# Patient Record
Sex: Female | Born: 1975 | Race: Black or African American | Hispanic: No | Marital: Single | Smoking: Never smoker
Health system: Southern US, Community
[De-identification: ages and names within clinical notes are randomized; demographics above are authoritative.]

## PROBLEM LIST (undated history)

## (undated) DIAGNOSIS — B009 Herpesviral infection, unspecified: Secondary | ICD-10-CM

## (undated) DIAGNOSIS — K219 Gastro-esophageal reflux disease without esophagitis: Secondary | ICD-10-CM

## (undated) DIAGNOSIS — J309 Allergic rhinitis, unspecified: Secondary | ICD-10-CM

## (undated) DIAGNOSIS — R87619 Unspecified abnormal cytological findings in specimens from cervix uteri: Secondary | ICD-10-CM

## (undated) DIAGNOSIS — E119 Type 2 diabetes mellitus without complications: Secondary | ICD-10-CM

## (undated) DIAGNOSIS — T7840XA Allergy, unspecified, initial encounter: Secondary | ICD-10-CM

## (undated) DIAGNOSIS — IMO0002 Reserved for concepts with insufficient information to code with codable children: Secondary | ICD-10-CM

## (undated) DIAGNOSIS — I10 Essential (primary) hypertension: Secondary | ICD-10-CM

## (undated) HISTORY — DX: Allergic rhinitis, unspecified: J30.9

## (undated) HISTORY — DX: Allergy, unspecified, initial encounter: T78.40XA

## (undated) HISTORY — DX: Type 2 diabetes mellitus without complications: E11.9

## (undated) HISTORY — PX: CERVIX LESION DESTRUCTION: SHX591

## (undated) HISTORY — DX: Essential (primary) hypertension: I10

## (undated) HISTORY — DX: Gastro-esophageal reflux disease without esophagitis: K21.9

## (undated) HISTORY — DX: Reserved for concepts with insufficient information to code with codable children: IMO0002

## (undated) HISTORY — DX: Herpesviral infection, unspecified: B00.9

## (undated) HISTORY — DX: Unspecified abnormal cytological findings in specimens from cervix uteri: R87.619

## (undated) HISTORY — PX: WISDOM TOOTH EXTRACTION: SHX21

---

## 1995-12-26 HISTORY — PX: COLPOSCOPY: SHX161

## 1999-06-20 ENCOUNTER — Other Ambulatory Visit: Admission: RE | Admit: 1999-06-20 | Discharge: 1999-06-20 | Payer: Self-pay | Admitting: Obstetrics and Gynecology

## 1999-10-20 ENCOUNTER — Emergency Department (HOSPITAL_COMMUNITY): Admission: EM | Admit: 1999-10-20 | Discharge: 1999-10-20 | Payer: Self-pay | Admitting: *Deleted

## 1999-10-24 ENCOUNTER — Emergency Department (HOSPITAL_COMMUNITY): Admission: EM | Admit: 1999-10-24 | Discharge: 1999-10-24 | Payer: Self-pay | Admitting: Emergency Medicine

## 1999-11-09 ENCOUNTER — Encounter: Admission: RE | Admit: 1999-11-09 | Discharge: 1999-11-30 | Payer: Self-pay | Admitting: Family Medicine

## 2000-06-06 ENCOUNTER — Other Ambulatory Visit: Admission: RE | Admit: 2000-06-06 | Discharge: 2000-06-06 | Payer: Self-pay | Admitting: Family Medicine

## 2000-10-30 ENCOUNTER — Inpatient Hospital Stay (HOSPITAL_COMMUNITY): Admission: AD | Admit: 2000-10-30 | Discharge: 2000-10-30 | Payer: Self-pay | Admitting: *Deleted

## 2001-02-24 ENCOUNTER — Inpatient Hospital Stay (HOSPITAL_COMMUNITY): Admission: AD | Admit: 2001-02-24 | Discharge: 2001-02-24 | Payer: Self-pay | Admitting: Obstetrics and Gynecology

## 2001-05-18 ENCOUNTER — Inpatient Hospital Stay (HOSPITAL_COMMUNITY): Admission: AD | Admit: 2001-05-18 | Discharge: 2001-05-20 | Payer: Self-pay | Admitting: Obstetrics and Gynecology

## 2001-05-21 ENCOUNTER — Encounter: Admission: RE | Admit: 2001-05-21 | Discharge: 2001-06-20 | Payer: Self-pay | Admitting: Obstetrics and Gynecology

## 2002-08-08 ENCOUNTER — Other Ambulatory Visit: Admission: RE | Admit: 2002-08-08 | Discharge: 2002-08-08 | Payer: Self-pay | Admitting: Family Medicine

## 2005-02-07 ENCOUNTER — Ambulatory Visit: Payer: Self-pay | Admitting: Family Medicine

## 2005-09-11 ENCOUNTER — Ambulatory Visit: Payer: Self-pay | Admitting: Family Medicine

## 2005-11-24 HISTORY — PX: PARTIAL HYSTERECTOMY: SHX80

## 2005-12-12 ENCOUNTER — Observation Stay (HOSPITAL_COMMUNITY): Admission: RE | Admit: 2005-12-12 | Discharge: 2005-12-13 | Payer: Self-pay | Admitting: Obstetrics and Gynecology

## 2005-12-12 ENCOUNTER — Encounter (INDEPENDENT_AMBULATORY_CARE_PROVIDER_SITE_OTHER): Payer: Self-pay | Admitting: Specialist

## 2006-08-31 ENCOUNTER — Ambulatory Visit: Payer: Self-pay | Admitting: Family Medicine

## 2006-09-24 ENCOUNTER — Encounter: Payer: Self-pay | Admitting: Family Medicine

## 2006-09-24 LAB — CONVERTED CEMR LAB

## 2006-12-04 ENCOUNTER — Ambulatory Visit: Payer: Self-pay | Admitting: Family Medicine

## 2006-12-14 ENCOUNTER — Ambulatory Visit: Payer: Self-pay | Admitting: Family Medicine

## 2007-02-13 ENCOUNTER — Ambulatory Visit: Payer: Self-pay | Admitting: Family Medicine

## 2007-07-02 ENCOUNTER — Ambulatory Visit: Payer: Self-pay | Admitting: Family Medicine

## 2007-07-02 DIAGNOSIS — J309 Allergic rhinitis, unspecified: Secondary | ICD-10-CM | POA: Insufficient documentation

## 2007-07-02 DIAGNOSIS — K219 Gastro-esophageal reflux disease without esophagitis: Secondary | ICD-10-CM | POA: Insufficient documentation

## 2007-07-02 LAB — CONVERTED CEMR LAB
Glucose, Urine, Semiquant: NEGATIVE
Ketones, urine, test strip: NEGATIVE
Nitrite: NEGATIVE
Specific Gravity, Urine: 1.01
Urobilinogen, UA: 0.2
pH: 6.5

## 2007-10-02 ENCOUNTER — Ambulatory Visit: Payer: Self-pay | Admitting: Family Medicine

## 2007-10-02 DIAGNOSIS — R300 Dysuria: Secondary | ICD-10-CM | POA: Insufficient documentation

## 2007-10-02 LAB — CONVERTED CEMR LAB
Bilirubin Urine: NEGATIVE
Ketones, urine, test strip: NEGATIVE
Nitrite: NEGATIVE
Protein, U semiquant: NEGATIVE
Urobilinogen, UA: NEGATIVE

## 2007-10-04 ENCOUNTER — Encounter (INDEPENDENT_AMBULATORY_CARE_PROVIDER_SITE_OTHER): Payer: Self-pay | Admitting: Internal Medicine

## 2007-10-07 ENCOUNTER — Encounter (INDEPENDENT_AMBULATORY_CARE_PROVIDER_SITE_OTHER): Payer: Self-pay | Admitting: Internal Medicine

## 2007-12-04 ENCOUNTER — Ambulatory Visit: Payer: Self-pay | Admitting: Family Medicine

## 2007-12-04 DIAGNOSIS — D692 Other nonthrombocytopenic purpura: Secondary | ICD-10-CM | POA: Insufficient documentation

## 2007-12-05 ENCOUNTER — Encounter (INDEPENDENT_AMBULATORY_CARE_PROVIDER_SITE_OTHER): Payer: Self-pay | Admitting: Internal Medicine

## 2007-12-16 ENCOUNTER — Encounter (INDEPENDENT_AMBULATORY_CARE_PROVIDER_SITE_OTHER): Payer: Self-pay | Admitting: Internal Medicine

## 2007-12-26 HISTORY — PX: LAMINECTOMY AND MICRODISCECTOMY LUMBAR SPINE: SHX1913

## 2007-12-30 ENCOUNTER — Ambulatory Visit: Payer: Self-pay | Admitting: Family Medicine

## 2007-12-30 DIAGNOSIS — M79609 Pain in unspecified limb: Secondary | ICD-10-CM

## 2008-01-01 ENCOUNTER — Telehealth: Payer: Self-pay | Admitting: Family Medicine

## 2008-01-02 ENCOUNTER — Encounter: Payer: Self-pay | Admitting: Family Medicine

## 2008-01-03 ENCOUNTER — Telehealth: Payer: Self-pay | Admitting: Family Medicine

## 2008-01-10 ENCOUNTER — Encounter: Payer: Self-pay | Admitting: Family Medicine

## 2008-01-27 ENCOUNTER — Ambulatory Visit (HOSPITAL_COMMUNITY): Admission: RE | Admit: 2008-01-27 | Discharge: 2008-01-27 | Payer: Self-pay | Admitting: Neurosurgery

## 2008-02-09 ENCOUNTER — Emergency Department: Payer: Self-pay | Admitting: Emergency Medicine

## 2008-02-09 ENCOUNTER — Encounter: Payer: Self-pay | Admitting: Family Medicine

## 2008-02-14 ENCOUNTER — Ambulatory Visit: Payer: Self-pay | Admitting: Family Medicine

## 2008-02-14 DIAGNOSIS — M5126 Other intervertebral disc displacement, lumbar region: Secondary | ICD-10-CM | POA: Insufficient documentation

## 2008-05-21 ENCOUNTER — Ambulatory Visit: Payer: Self-pay | Admitting: Family Medicine

## 2008-05-21 DIAGNOSIS — M545 Low back pain: Secondary | ICD-10-CM

## 2008-09-07 ENCOUNTER — Ambulatory Visit: Payer: Self-pay | Admitting: Family Medicine

## 2008-09-07 DIAGNOSIS — J019 Acute sinusitis, unspecified: Secondary | ICD-10-CM

## 2008-09-07 DIAGNOSIS — N760 Acute vaginitis: Secondary | ICD-10-CM | POA: Insufficient documentation

## 2008-09-07 DIAGNOSIS — J329 Chronic sinusitis, unspecified: Secondary | ICD-10-CM | POA: Insufficient documentation

## 2010-02-02 ENCOUNTER — Telehealth: Payer: Self-pay | Admitting: Family Medicine

## 2010-04-18 ENCOUNTER — Ambulatory Visit: Payer: Self-pay | Admitting: Family Medicine

## 2011-01-24 NOTE — Progress Notes (Signed)
Summary: back pain  Phone Note Call from Patient Call back at [phone]   Caller: Patient Call For: Sophia Part MD Summary of Call: Pt states she turned wrong yesterday morning and pulled her back.  She is having muscle spams. She has an appt with you on friday but is asking if she can have something called in- she had flexeri and vicodin before.  Taking ibuprofen and using a heating pad. Uses [facility] [address] Initial call taken by: Lowella Petties CMA,  February 02, 2010 12:01 PM  Follow-up for Phone Call        can try flexeril - use caution  if any significant numbness or weakness- update me  px written on EMR for call in  Follow-up by: Sophia Part MD,  February 02, 2010 1:40 PM  Additional Follow-up for Phone Call Additional follow up Details #1::        Patient notified as instructed by telephone. Medication phoned to [facility] [address] [place]. [phone] pharmacy as instructed. Lewanda Rife LPN  February 02, 2010 3:46 PM      New/Updated Medications: FLEXERIL 10 MG TABS (CYCLOBENZAPRINE HCL) 1 by mouth up to three times a day as needed back pain  watch for sedation Prescriptions: FLEXERIL 10 MG TABS (CYCLOBENZAPRINE HCL) 1 by mouth up to three times a day as needed back pain  watch for sedation  #30 x 0   Entered and Authorized by:   Sophia Part MD   Signed by:   Sophia Part MD on 02/02/2010   Method used:   Telephoned to ...         RxID:   [id number]

## 2011-01-24 NOTE — Assessment & Plan Note (Signed)
Summary: SINUS INFECTION/DLO   Vital Signs:  Patient profile:   35 year old female Height:      62 inches Weight:      184.50 pounds BMI:     33.87 Temp:     99.2 degrees F oral Pulse rate:   88 / minute Pulse rhythm:   regular BP sitting:   130 / 88  (left arm) Cuff size:   large  Vitals Entered By: Sydell Axon LPN (April 18, 2010 2:19 PM) CC: ? Sinus infection, head congestion, face feels swollen, nasal congestion yellow with some blood   History of Present Illness: Pt here for facial swelling and nasal conmgestion with yellow discharge. Throt itching and coughing a lot. She has been warm, she has chills, headache bitemporal, no ear pain but hads congestion, Nasal congestion with occas yellow and blody mucous, Some ST, Coughing up but not getting much phlegm, no SOB, no N/V. She has taken Alka Selzer Plus and had used Aleve.   Allergies: 1)  ! * Monostat 2)  ! Morphine 3)  Vioxx  Physical Exam  General:  alert, well-developed, well-nourished, and well-hydrated.  NAD Head:  normocephalic, atraumatic, and no abnormalities observed.  minimal  maxillary and frontal sinus tenderness  Eyes:  Conjunctiva clear bilaterally.  Ears:  L TM mildly distaorted. R TM significantly distorted but nonerythem without fluid. Nose:  nares are congested and injected bilaterally, R>>>L. Mouth:  pharynx pink and moist, no erythema, and no exudates.   Neck:  supple with full rom and no masses or thyromegally, no JVD or carotid bruit  Lungs:  Normal respiratory effort, chest expands symmetrically. Lungs are clear to auscultation, no crackles or wheezes. Heart:  Normal rate and regular rhythm. S1 and S2 normal without gallop, murmur, click, rub or other extra sounds.   Impression & Recommendations:  Problem # 1:  URI (ICD-465.9) See instructions Her updated medication list for this problem includes:    Tessalon 200 Mg Caps (Benzonatate) ..... One tab by mouth three times a day as needed  cough  Instructed on symptomatic treatment. Call if symptoms persist or worsen.   Complete Medication List: 1)  Prilosec Cpdr (Omeprazole cpdr) .... Take by mouth as directed prn 2)  Multi-vitamin Tabs (Multiple vitamin) .... Take by mouth as directed 3)  Flonase 50 Mcg/act Susp (Fluticasone propionate) .... 2 sprays each nostril as needed 4)  Flexeril 10 Mg Tabs (Cyclobenzaprine hcl) .Marland Kitchen.. 1 by mouth up to three times a day as needed back pain  watch for sedation 5)  Amoxicillin 500 Mg Caps (Amoxicillin) .... 2 tabs by mouth two times a day 6)  Tessalon 200 Mg Caps (Benzonatate) .... One tab by mouth three times a day as needed cough  Patient Instructions: 1)  Take Guaifenesin by [facility] to [facility] and getting MUCOUS RELIEF EXPECTORANT (400mg ), take 11/2 tabs by mouth AM and NOON. 2)  Drink lots of fluids anytime taking Guaifenesin.  3)  Take Tyl ES 2 tabs by mouth three times a day  4)  Keep Lozenge in mouth for a few fdays while awake.  5)  Vicks as directed. 6)  Tessalon three times a day for cough 7)  If no better by Thu, start Amox. Prescriptions: TESSALON 200 MG CAPS (BENZONATATE) one tab by mouth three times a day as needed cough  #30 x 0   Entered and Authorized by:   Shaune Leeks MD   Signed by:  Shaune Leeks MD on 04/18/2010   Method used:   Electronically to        [facility]. [address] [phone]* (retail)       [address] [place], [place]  [id number]       Ph: [phone]       Fax: [phone]   RxID:   [id number] AMOXICILLIN 500 MG CAPS (AMOXICILLIN) 2 tabs by mouth two times a day  #40 x 0   Entered and Authorized by:   Shaune Leeks MD   Signed by:   Shaune Leeks MD on 04/18/2010   Method used:   Electronically to        [facility]. [address] [phone]* (retail)       [address] [place], [place]  [id number]       Ph: [phone]       Fax: [phone]   RxID:    [id number]   Current Allergies (reviewed today): ! * MONOSTAT ! MORPHINE VIOXX

## 2011-03-29 ENCOUNTER — Other Ambulatory Visit: Payer: Self-pay | Admitting: *Deleted

## 2011-03-29 MED ORDER — FLUTICASONE PROPIONATE 50 MCG/ACT NA SUSP: 2.0000 | Freq: Every day | NASAL | Status: DC

## 2011-05-09 NOTE — Op Note (Signed)
[name], [name]              ACCOUNT NO.:  [id number]   MEDICAL RECORD NO.:  [id number]          PATIENT TYPE:  INP   LOCATION:  2857                         FACILITY:  [facility]   PHYSICIAN:  Kathaleen Maser. Pool, M.D.    DATE OF BIRTH:  09-26-1976   DATE OF PROCEDURE:  01/27/2008  DATE OF DISCHARGE:                               OPERATIVE REPORT   PREOPERATIVE DIAGNOSIS:  Left L5-S1 herniated nucleus pulposus with  radiculopathy.   POSTOPERATIVE DIAGNOSIS:  Left L5-S1 herniated nucleus pulposus with  radiculopathy.   PROCEDURE NOTE:  Left L5-S1 laminotomy and microdiscectomy.   SURGEON:  Kathaleen Maser. Pool, M.D.   ASSISTANT:  Donalee Citrin, M.D.   ANESTHESIA:  General endotracheal anesthesia.   PREMEDICATION:  Ms. Logan Bores is a 35 year old female with history of back  and left lower extremity pain, paresthesias, weakness.  There is a left-  sided S1 radiculopathy which failed conservative.  Workup demonstrates  evidence of a leftward L5-S1 disk herniation with compression of the  left-sided S1 nerve root.  The patient has been counseled as to her  options.  She desired to proceed with left-sided L5-S1 laminotomy and  microdiscectomy in hopes to improve her symptoms.   DESCRIPTION OF PROCEDURE:  Patient was brought to the operating room and  placed on the operating table in supine position.  After adequate level  of anesthesia was achieved, the patient was positioned prone onto Wilson  frame, appropriately padded the patient's lumbar region,  prepped and  draped sterilely.  A 10-blade was used to make a linear skin incision  overlying the L5-S1 interspace.  This was carried down sharply in the  midline.  Subperiosteal dissection was performed to expose the lamina of  facet joints at L5 and S1 on the left side.  Deep self-retaining  retractor was placed.  Intraoperative x-rays taken and level was  confirmed.  Laminotomy was then performed using high-speed drill and  Kerrison rongeurs to  remove the inferior aspect of the lamina of L5,  medial aspect of L5-S1 facet joints and the superior rim of the S1  lamina.  Ligament flavum was then elevated and resected in piecemeal  fashion.  Underlying thecal sac and exiting S1 nerve root were  identified.  The microscope was brought into the field for  microdissection left side S1 nerve root and underlying disk herniation.  Epidural venous plexus coagulated and cut.  Thecal sac and S1 nerve root  were gently mobilized retracted towards the midline.  Disk herniation  was readily apparent.  This was then incised with a 15 blade in  rectangular fashion.  Wide disk space clean-out was achieved using  pituitary rongeurs, upward angle pituitary rongeurs and Epstein  curettes.  All elements of the disk herniation were completely resected.  All loose or obviously degenerative disk material was removed from the  interspace.  After very thorough diskectomy had been achieved, wound was  then irrigated with antibiotic solution.  Gelfoam was placed topically  for hemostasis and found to be good.  Microscope and retractor system  were  removed.  Hemostasis was achieved with  electrocautery.  Wounds were  closed in layers with Vicryl sutures.  Steri-Strips and sterile dressing  were applied.  There were no apparent complications.  The patient  tolerated the procedure well and she returns to the recovery room  postoperatively.           ______________________________  Kathaleen Maser Pool, M.D.     HAP/MEDQ  D:  01/27/2008  T:  01/27/2008  Job:  [id number]
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
## 2011-05-12 NOTE — H&P (Signed)
[name], [name]              ACCOUNT NO.:  [id number]   MEDICAL RECORD NO.:  [id number]          PATIENT TYPE:  OBV   LOCATION:  9399                          [facility]ILITY:  [facility]   PHYSICIAN:  Naima A. Dillard, M.D. DATE OF BIRTH:  [birth year]   DATE OF ADMISSION:  12/12/2005  DATE OF DISCHARGE:                                HISTORY & PHYSICAL   CHIEF COMPLAINT:  Symptomatic fibroids.   HISTORY OF PRESENT ILLNESS:  The patient is a 35 year old African-American  female gravida 2, para 2 who presented to me on September 26, 2005 for  sonohysterogram. She was found to have a submucosal fibroid and endometrial  polyp and patient was scheduled for hysteroscopy, D and C, removal of  fibroid and polyp.  The patient returned to me a couple of weeks later, and  after talking with her husband, stated that she was confident that she did  not want to have any more children and wanted definitive treatment, wanted a  hysterectomy.  The patient understands the risks of hysterectomy are, but  not limited to, bleeding, infection, damage to internal organs such as  bowel, bladder, major blood vessels. Patient signed hysterectomy consent and  desires to proceed.  She presented back on September 04, 2005 complaining of  irregular bleeding stating that her menses had been heavy since the birth of  her last child.  She had a five day flow with change of pads every 1-1/2  hours with some cramps and occasionally soiling of clothing.  She also had  some postcoital spotting.  The patient was on Seasonal for birth control,  does have fibroids, has not been on any new medications.  She denies  menopausal symptoms or vaginal discharge, increased stress or severe  abdominal pain.   PAST MEDICAL HISTORY:  As above.   PAST SURGICAL HISTORY:  Significant for cryosurgery of cervix.   OBSTETRICAL HISTORY:  Past OB history significant for vaginal delivery X2  without any complications.   SOCIAL HISTORY:  Negative  for alcohol, tobacco or drug use.   FAMILY HISTORY:  Significant for grandfather with diabetes and maternal  grandmother with hypertension.   REVIEW OF SYSTEMS:  GENITOURINARY:  See above.  ENDOCRINE, MUSCULOSKELETAL,  PSYCHIATRIC, CARDIOVASCULAR, RESPIRATORY and GASTROINTESTINAL are all  unremarkable.   PHYSICAL EXAMINATION:  VITAL SIGNS:  The patient weighs 187 pounds.  Blood  pressure 130/78.  HEENT: Pupils are equal.  Hearing is normal.  Throat is clear.  NECK:  Thyroid is not enlarged.  HEART:  Has regular rate and rhythm.  LUNGS:  Clear to auscultation bilaterally.  ABDOMEN:  Soft and nontender without any masses or organomegaly.  EXTREMITIES:  No cyanosis, clubbing or edema.  NEUROLOGICAL:  Examination is within normal limits.  PELVIC:  Full vaginal examination is within normal limits.  Cervix is  nontender without any lesions.  Uterus is normal shape, size and  consistency.  Adnexa has no masses and nontender.  Rectovaginal examination  shows no masses.   LABORATORY DATA:  Pertinent data includes ultrasound showing uterine length  measuring 8.62 X 5.05 X 5.33 with anterior  fibroid measuring 2.6 X 1.7 X  2.13 and a posterior submucosal fibroid measuring 2.3 X 1.7 cm.  Hemoglobin  is 13.7. TSH is normal at 1.401.  Gonorrhea and Chlamydia both negative.   ASSESSMENT:  Symptomatic fibroids causing menorrhagia.  Patient was offered  observation, to continue or different forms of birth control, hysteroscopic  myomectomy and polypectomy, Lupron and hysterectomy.  Patient was going to  have a hysteroscopic myomectomy at first and she did receive Lupron but  later decided she wanted definitive treatment.  The plan is a  laparoscopically assisted vaginal hysterectomy, possible total abdominal  hysterectomy.  The patient understands the risks of hysterectomy and has  signed the consent.      Naima A. Normand Sloop, M.D.  Electronically Signed     NAD/MEDQ  D:  12/11/2005  T:   12/12/2005  Job:  [id number]

## 2011-05-12 NOTE — Discharge Summary (Signed)
Sophia, [name]              ACCOUNT NO.:  [id number]   MEDICAL RECORD NO.:  [id number]          PATIENT TYPE:  OBV   LOCATION:  9316                          FACILITY:  [facility]   PHYSICIAN:  Naima A. Dillard, M.D. DATE OF BIRTH:  1976/01/19   DATE OF ADMISSION:  12/12/2005  DATE OF DISCHARGE:  12/13/2005                                 DISCHARGE SUMMARY   DISCHARGE DIAGNOSES:  1.  Symptomatic uterine fibroids.  2.  Menorrhagia.   OPERATION:  On the date of admission, the patient underwent a  laparoscopically-assisted vaginal hysterectomy, tolerating procedure well.  The patient was found to have a uterus weighing approximately 105 grams,  along with normal appearing tubes and ovaries bilaterally.   HISTORY OF PRESENT ILLNESS:  Ms. [name] is a 35 year old, African-American  female, para 2-0-0-2 who presents for hysterectomy because of symptomatic  uterine fibroids.  Please see the patient's dictated history and physical  examination for details.   PREOPERATIVE PHYSICAL EXAMINATION:  VITAL SIGNS:  Weight is 187 pounds,  blood pressure 130/78.  GENERAL:  Within normal limits.  PELVIC:  Full vaginal exam is within normal limits.  Cervix is nontender  without any lesions.  Uterus is normal shape, size, and consistency.  Adnexa  had no masses and is nontender.  Rectovaginal exam shows no masses.   HOSPITAL COURSE:  On the date of admission, the patient underwent  aforementioned procedure, tolerating it well.  Postoperative course was  unremarkable with the patient tolerating a post-op hemoglobin of 11.4,  (preop hemoglobin 13.4).  By post-op day #1, the patient had resumed bowel  and bladder function and was therefore, deemed ready for discharge home.   DISCHARGE MEDICATIONS:  1.  Phenergan 25 mg every six hours as needed for nausea.  2.  Colace 100 mg twice daily until bowel movements are regular.  3.  Tylox 1-2 tablets every four hours as needed for pain.   FOLLOW UP:  The  patient is scheduled for a six weeks post-op visit with Dr.  Normand Sloop on January 22, 2006 at 2:30 p.m..   DISCHARGE INSTRUCTIONS:  The patient was given a copy of [facility] postoperative instruction sheet.  She was further advised to avoid  driving for 2 weeks, heavy lifting for 4 weeks, intercourse for 6 weeks,  that she may bathe, walk up steps, and is to increase her activities slowly.   The patient's diet is without restriction.   FINAL PATHOLOGY:  Uterus and cervix:  Slight cervicitis, proliferative  endometrium.  No evidence of hyperplasia or malignancy, benign myometrium,  benign uterine serosa.      [place] J. Adline Peals.      Naima A. Normand Sloop, M.D.  Electronically Signed    EJP/MEDQ  D:  12/29/2005  T:  12/29/2005  Job:  [id number]

## 2011-05-12 NOTE — Op Note (Signed)
[name], [name]              ACCOUNT NO.:  [id number]   MEDICAL RECORD NO.:  [id number]          PATIENT TYPE:  OBV   LOCATION:  9316                          FACILITY:  [facility]   PHYSICIAN:  Naima A. Dillard, M.D. DATE OF BIRTH:  [birth year]   DATE OF PROCEDURE:  12/12/2005  DATE OF DISCHARGE:                                 OPERATIVE REPORT   PREOPERATIVE DIAGNOSIS:  Symptomatic uterine fibroids and menorrhagia.   POSTOPERATIVE DIAGNOSIS:  Symptomatic uterine fibroids and menorrhagia.   OPERATION/PROCEDURE:  Laparoscopic-assisted vaginal hysterectomy.   SURGEON:  Naima A. Normand Sloop, M.D.   ASSISTANTMarquis Lunch. Powell, P.A.-C.   ANESTHESIA:  General endotracheal anesthesia.   SPECIMENS:  Uterus and cervix, 105 g.   ESTIMATED BLOOD LOSS:  100 mL.   URINARY OUTPUT:  700 mL clear urine.   IV FLUIDS:  2500 mL.   COMPLICATIONS:  None.   DISPOSITION:  The patient went to PACU in stable condition.   DESCRIPTION OF PROCEDURE:  The patient was taken to the operating room where  she was given general anesthesia and placed in the dorsal lithotomy position  and prepped and draped in the normal sterile fashion.  A bivalve speculum  was placed into the vagina.  The anterior lip of the cervix grasped with a  single-tooth tenaculum and the Hulka manipulator was placed into the uterus.  The speculum was then removed from the vagina.   Attention was then turned to the umbilicus where a 10 mm infraumbilical  incision was made after 5 mL of 0.25% Marcaine was injected and this was  carried down to the fascia.  The fascia was incised in the midline and  extended bilaterally.  The peritoneum was identified, tented up and entered  sharply.  The fascia was then stitched with 0 Vicryl in a pursestring  fashion and the Hasson was placed and anchored a suture.  Abdomen was  insufflated with CO2 gas.  Intra-abdominal placement was confirmed with the  laparoscope.  The patient had about a  six-week size uterus, small fibroids,  again her fibroids are mainly submucosal, normal-appearing tubes and  ovaries, normal-appearing abdominal anatomy.  Her appendix  could not be  visualized.  In the right lower quadrant and left lower quadrant 5 mL  incisions were made after infiltration with 0.25% Marcaine and two 5 mm  trocars were placed under direct visualization with the laparoscope.  The  patient's round ligaments were grasped and cauterized and water dissected.  The bladder flap was water dissected.  The vesicouterine peritoneum was then  cut with Metzenbaum scissors and pulled away to create a bladder flap.  The  patient's right utero-ovarian ligament was cauterized and cut.  Hemostasis  was assured.  The patient's left utero-ovarian ligament was cauterized and  cut.  Hemostasis was assured.   Attention was then turned to the vagina where a small weighted speculum was  placed in the vagina.  The Hulka tenaculum was then removed.  The single-  tooth tenaculum was placed on the cervix and the cervix was infiltrated with  20 mL of Pitressin.  Mixture is 20 in 100 mL.  A circumferential incision  was then made around the cervix.  The bladder and rectum was then dissected  away from the cervix.  The posterior and anterior cul-de-sac were entered  without difficulty.  Both uterosacral ligaments were flanked with Heaney  clamps, cut and suture ligated.  Hemostasis was assured.  Both uterine  arteries were doubly clamped, cut and suture ligated.  Hemostasis was  assured.  The uterus was removed.  There was some bleeding along the cuff of  the left side of the patient which was made hemostatic with figure-of-eight  stitch.  McCall suture was placed without difficulty.  The cuff was closed  in a horizontal fashion using 0 chromic.  Hemostasis was assured.   Attention was then turned back to the abdomen.  The abdomen was again  insufflated.  The area was irrigated and all irrigation was  removed.  The  two 5 mm ports were removed under direct visualization of the laparoscope.  The Hasson was then removed.  The pursestring suture was then tied to close  the fascia.  The skin incisions were closed in subcuticular fashion using 3-  0 Monocryl.  The vagina was packed with vaginal packing with aminoserve  cream.  Sponge, lap and needle counts were correct x2.  The patient went to  the recovery room in stable condition.      Naima A. Normand Sloop, M.D.  Electronically Signed     NAD/MEDQ  D:  12/12/2005  T:  12/13/2005  Job:  [id number]

## 2011-09-15 LAB — TYPE AND SCREEN
ABO/RH(D): B NEG
Antibody Screen: NEGATIVE

## 2011-09-15 LAB — CBC
MCHC: 33.9
Platelets: 191
RDW: 13.1

## 2012-03-13 ENCOUNTER — Encounter (INDEPENDENT_AMBULATORY_CARE_PROVIDER_SITE_OTHER): Payer: 59 | Admitting: Obstetrics and Gynecology

## 2012-03-13 DIAGNOSIS — A599 Trichomoniasis, unspecified: Secondary | ICD-10-CM

## 2012-03-13 DIAGNOSIS — N760 Acute vaginitis: Secondary | ICD-10-CM

## 2012-09-16 ENCOUNTER — Encounter: Payer: Self-pay | Admitting: Family Medicine

## 2012-09-16 ENCOUNTER — Ambulatory Visit (INDEPENDENT_AMBULATORY_CARE_PROVIDER_SITE_OTHER): Payer: 59 | Admitting: Family Medicine

## 2012-09-16 VITALS — BP 158/94 | HR 78 | Temp 98.5°F | Ht 63.0 in | Wt 182.8 lb

## 2012-09-16 DIAGNOSIS — I10 Essential (primary) hypertension: Secondary | ICD-10-CM

## 2012-09-16 DIAGNOSIS — R519 Headache, unspecified: Secondary | ICD-10-CM | POA: Insufficient documentation

## 2012-09-16 DIAGNOSIS — R51 Headache: Secondary | ICD-10-CM

## 2012-09-16 DIAGNOSIS — R739 Hyperglycemia, unspecified: Secondary | ICD-10-CM

## 2012-09-16 DIAGNOSIS — R7309 Other abnormal glucose: Secondary | ICD-10-CM

## 2012-09-16 MED ORDER — LISINOPRIL-HYDROCHLOROTHIAZIDE 10-12.5 MG PO TABS: 1.0000 | ORAL_TABLET | Freq: Every day | ORAL | Status: DC

## 2012-09-16 NOTE — Assessment & Plan Note (Signed)
Per pt thirst and freq urination ? 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

## 2012-09-16 NOTE — Assessment & Plan Note (Signed)
I think this is rel to bp  Will see how it is as this comes down  If not - investigate other causes Can also investigate poss of beta blocker or ca channel blocker

## 2012-09-16 NOTE — Progress Notes (Signed)
Subjective:    Patient ID: [name], female    DOB: October 07, 1976, 36 y.o.   MRN: [id number]  HPI Here with inc bp and headaches  More headaches lately- thought it was from her sinuses  Went to wellness check at work- bp high Over weekend - went to grocery store and started to check bp there 160/90s for the most part   Not taking decongestants No diet pills    Mother had HTN   Is overwt   Head aches - at the temples/ pressure sensation  R ear bothers her on and off  She may have migraines - hard to tell  Not eating much sodium- has to be sodium free for her son Does not eat out often  Not swollen, but tends to feel generally bloated  Exercise is sporatic    Patient Active Problem List  Diagnosis  . OTHER NONTHROMBOCYTOPENIC PURPURAS  . SINUSITIS- ACUTE-NOS  . ALLERGIC RHINITIS  . GERD  . BACTERIAL VAGINITIS  . HERNIATED LUMBAR DISC  . LOW BACK PAIN, ACUTE  . LEG PAIN, LEFT  . DYSURIA   Past Medical History  Diagnosis Date  . Allergic rhinitis     seasonal  . GERD (gastroesophageal reflux disease)    Past Surgical History  Procedure Date  . Colposcopy 1997    abnormal pap  . Partial hysterectomy 11/2005    bleeding  . Laminectomy and microdiscectomy lumbar spine 12/2007    L5-S1   History  Substance Use Topics  . Smoking status: Never Smoker   . Smokeless tobacco: Not on file  . Alcohol Use: Yes     social   Family History  Problem Relation Age of Onset  . Hypertension Mother 64  . Diabetes Paternal Grandfather   . Breast cancer Maternal Grandmother   . Colon cancer Other     MGaunt  . Stroke Paternal Grandmother    Allergies  Allergen Reactions  . Morphine     REACTION: skin crawling  . Rofecoxib     REACTION: blurred vision, dizziness   Current Outpatient Prescriptions on File Prior to Visit  Medication Sig Dispense Refill  . fluticasone (FLONASE) 50 MCG/ACT nasal spray 2 sprays by Nasal route daily.  16 g  0       Review of Systems Review of Systems  Constitutional: Negative for fever, appetite change, fatigue and unexpected weight change.  Eyes: Negative for pain and visual disturbance.  Respiratory: Negative for cough and shortness of breath.   Cardiovascular: Negative for cp or palpitations    Gastrointestinal: Negative for nausea, diarrhea and constipation. pos for a general bloated feeling  Genitourinary: Negative for urgency and frequency.  Skin: Negative for pallor or rash   Neurological: Negative for weakness, light-headedness, numbness and pos for headaches.  Hematological: Negative for adenopathy. Does not bruise/bleed easily.  Psychiatric/Behavioral: Negative for dysphoric mood. The patient is not nervous/anxious.         Objective:   Physical Exam  Constitutional: She appears well-developed and well-nourished. No distress.  HENT:  Head: Normocephalic and atraumatic.  Mouth/Throat: Oropharynx is clear and moist. No oropharyngeal exudate.  Eyes: Conjunctivae normal and EOM are normal. Pupils are equal, round, and reactive to light. No scleral icterus.  Neck: Normal range of motion. Neck supple. No JVD present. Carotid bruit is not present. No thyromegaly present.  Cardiovascular: Normal rate, regular rhythm, normal heart sounds and intact distal pulses.  Exam reveals no gallop.   No murmur heard. Pulmonary/Chest:  Effort normal and breath sounds normal. No respiratory distress. She has no wheezes.  Abdominal: Soft. Bowel sounds are normal. She exhibits no distension, no abdominal bruit and no mass. There is no tenderness.  Musculoskeletal: She exhibits no edema and no tenderness.  Lymphadenopathy:    She has no cervical adenopathy.  Neurological: She is alert. She has normal reflexes. She displays no atrophy and no tremor. No cranial nerve deficit or sensory deficit. She exhibits normal muscle tone. Coordination and gait normal.       No focal cerebellar signs  Skin: Skin is warm  and dry. No rash noted. No erythema. No pallor.  Psychiatric: She has a normal mood and affect.          Assessment & Plan:

## 2012-09-16 NOTE — Assessment & Plan Note (Signed)
This is new and likely the source of her headache Will start lisinopril hct  Disc lifestyle change  Given handout  Labs today F/u in 2-3 wk See avs

## 2012-09-16 NOTE — Patient Instructions (Addendum)
I think you have developed hypertension (high blood pressure) Eat a healthy low salt diet and try to exercise  Try lisinopril hct- if any side effects like cough or dizziness - stop and let me know  Follow up with me in 2-3 weeks  Lab today  Get your flu shot at work If you decide to get a bp cuff for home - get OMRON cuff for arm- regular size - and keep a log If headache worsens- let me know

## 2012-09-17 ENCOUNTER — Encounter: Payer: Self-pay | Admitting: *Deleted

## 2012-09-17 LAB — CBC WITH DIFFERENTIAL/PLATELET
Basophils Absolute: 0 10*3/uL (ref 0.0–0.2)
Basos: 1 % (ref 0–3)
Eosinophils Absolute: 0.2 10*3/uL (ref 0.0–0.4)
Immature Grans (Abs): 0 10*3/uL (ref 0.0–0.1)
Immature Granulocytes: 0 % (ref 0–2)
MCH: 30.7 pg (ref 26.6–33.0)
MCHC: 33.5 g/dL (ref 31.5–35.7)
MCV: 92 fL (ref 79–97)
Monocytes Absolute: 0.4 10*3/uL (ref 0.1–0.9)
Neutrophils Relative %: 55 % (ref 40–74)
RDW: 13.6 % (ref 12.3–15.4)

## 2012-09-17 LAB — COMPREHENSIVE METABOLIC PANEL
ALT: 13 IU/L (ref 0–32)
AST: 16 IU/L (ref 0–40)
Albumin/Globulin Ratio: 1.9 (ref 1.1–2.5)
Calcium: 9.3 mg/dL (ref 8.7–10.2)
Chloride: 98 mmol/L (ref 97–108)
GFR calc non Af Amer: 101 mL/min/{1.73_m2} (ref >59)
Potassium: 4.2 mmol/L (ref 3.5–5.2)
Sodium: 138 mmol/L (ref 134–144)

## 2012-09-17 LAB — LIPID PANEL WITH LDL/HDL RATIO
Cholesterol, Total: 168 mg/dL (ref 100–199)
LDL Calculated: 86 mg/dL (ref 0–99)
LDl/HDL Ratio: 1.3 ratio units (ref 0.0–3.2)

## 2012-09-24 ENCOUNTER — Telehealth: Payer: Self-pay | Admitting: Family Medicine

## 2012-09-24 MED ORDER — METOPROLOL SUCCINATE ER 50 MG PO TB24: 50.0000 mg | ORAL_TABLET | Freq: Every day | ORAL | Status: DC

## 2012-09-24 NOTE — Telephone Encounter (Signed)
Notified pt about metoprolol and the side effects to look out for, called in Rx as prescribed

## 2012-09-24 NOTE — Telephone Encounter (Signed)
I want to add another med for bp and headache  Metoprolol  Please call it in  If side effects please let me know (for instance dizziness or low bp)  F/u as planned this mo

## 2012-09-24 NOTE — Telephone Encounter (Signed)
The pt called the triage line and reported her headaches are still present and have not gotten any better.  Her bp last night was 149/81 and 142/78.  This am when she checked her bp it was 144/74.  She is hoping to get advice on what to do.  Her callback number is [phone].   Thanks!

## 2012-09-25 ENCOUNTER — Telehealth: Payer: Self-pay | Admitting: Family Medicine

## 2012-09-25 NOTE — Telephone Encounter (Signed)
Caller: Rorey/Patient; Patient Name: [name]; PCP: Roxy Manns [facility]); Best Callback Phone Number: [phone]  09-25-12  she is calling because a new blood pressure medicine was added to her current medications  They are giving flu shots at her job tomorrow and she wants to make sure she can take one  She has no fever and no signs of illness   I told her it would be fine to get the flu shot

## 2012-09-25 NOTE — Telephone Encounter (Signed)
Yes, that is fine. 

## 2012-10-08 ENCOUNTER — Ambulatory Visit (INDEPENDENT_AMBULATORY_CARE_PROVIDER_SITE_OTHER): Payer: 59 | Admitting: Family Medicine

## 2012-10-08 ENCOUNTER — Encounter: Payer: Self-pay | Admitting: Family Medicine

## 2012-10-08 VITALS — BP 122/66 | HR 84 | Temp 98.7°F | Ht 63.0 in | Wt 177.8 lb

## 2012-10-08 DIAGNOSIS — R739 Hyperglycemia, unspecified: Secondary | ICD-10-CM

## 2012-10-08 DIAGNOSIS — R51 Headache: Secondary | ICD-10-CM

## 2012-10-08 DIAGNOSIS — I10 Essential (primary) hypertension: Secondary | ICD-10-CM

## 2012-10-08 DIAGNOSIS — R7309 Other abnormal glucose: Secondary | ICD-10-CM

## 2012-10-08 MED ORDER — METOPROLOL SUCCINATE ER 50 MG PO TB24: 50.0000 mg | ORAL_TABLET | Freq: Every day | ORAL | Status: DC

## 2012-10-08 MED ORDER — LISINOPRIL-HYDROCHLOROTHIAZIDE 10-12.5 MG PO TABS: 1.0000 | ORAL_TABLET | Freq: Every day | ORAL | Status: DC

## 2012-10-08 NOTE — Patient Instructions (Addendum)
I'm glad you are feeling better  bp is better - keep an eye on that  Keep exercising and working on a healthy diet  Lab today Follow up in in 6 months

## 2012-10-08 NOTE — Assessment & Plan Note (Signed)
Much improved with metoprolol and also bp decrease Will continue to follow Disc imp of good hydration and sleep habits also

## 2012-10-08 NOTE — Assessment & Plan Note (Signed)
a1c was 5.5- very reassuring Disc healthy low glycemic diet and exercise

## 2012-10-08 NOTE — Progress Notes (Signed)
Subjective:    Patient ID: [name], female    DOB: May 29, 1976, 36 y.o.   MRN: [id number]  HPI Here for HTN last visit - started lisinopril hct  Brought down some but not to goal and still had headaches Added the metoprolol - and this has helped both problems   Took 3-4 days for headaches to regress No headache past few days/ and pressure sensation is gone too   Got light headed once at the gym , and nauseated  Has not been back since then  No wheeze or asthma symptoms    Patient Active Problem List  Diagnosis  . OTHER NONTHROMBOCYTOPENIC PURPURAS  . SINUSITIS- ACUTE-NOS  . ALLERGIC RHINITIS  . GERD  . BACTERIAL VAGINITIS  . HERNIATED LUMBAR DISC  . LOW BACK PAIN, ACUTE  . LEG PAIN, LEFT  . DYSURIA  . Essential hypertension  . Hyperglycemia  . Headache   Past Medical History  Diagnosis Date  . Allergic rhinitis     seasonal  . GERD (gastroesophageal reflux disease)    Past Surgical History  Procedure Date  . Colposcopy 1997    abnormal pap  . Partial hysterectomy 11/2005    bleeding  . Laminectomy and microdiscectomy lumbar spine 12/2007    L5-S1   History  Substance Use Topics  . Smoking status: Never Smoker   . Smokeless tobacco: Not on file  . Alcohol Use: Yes     social   Family History  Problem Relation Age of Onset  . Hypertension Mother 75  . Diabetes Paternal Grandfather   . Breast cancer Maternal Grandmother   . Colon cancer Other     MGaunt  . Stroke Paternal Grandmother    Allergies  Allergen Reactions  . Morphine     REACTION: skin crawling  . Rofecoxib     REACTION: blurred vision, dizziness   Current Outpatient Prescriptions on File Prior to Visit  Medication Sig Dispense Refill  . lisinopril-hydrochlorothiazide (PRINZIDE,ZESTORETIC) 10-12.5 MG per tablet Take 1 tablet by mouth daily.  30 tablet  3  . metoprolol succinate (TOPROL-XL) 50 MG 24 hr tablet Take 1 tablet (50 mg total) by mouth daily. Take with or  immediately following a meal.  30 tablet  0  . fluticasone (FLONASE) 50 MCG/ACT nasal spray 2 sprays by Nasal route daily.  16 g  0        Review of Systems    Review of Systems  Constitutional: Negative for fever, appetite change, fatigue and unexpected weight change.  Eyes: Negative for pain and visual disturbance.  Respiratory: Negative for cough and shortness of breath.   Cardiovascular: Negative for cp or palpitations    Gastrointestinal: Negative for nausea, diarrhea and constipation.  Genitourinary: Negative for urgency and frequency.  Skin: Negative for pallor or rash   Neurological: Negative for weakness, light-headedness, numbness and headaches.  Hematological: Negative for adenopathy. Does not bruise/bleed easily.  Psychiatric/Behavioral: Negative for dysphoric mood. The patient is not nervous/anxious.      Objective:   Physical Exam  Constitutional: She appears well-developed and well-nourished. No distress.       overwt and well appearing   HENT:  Head: Normocephalic and atraumatic.  Mouth/Throat: Oropharynx is clear and moist.  Eyes: Conjunctivae normal and EOM are normal. Pupils are equal, round, and reactive to light. No scleral icterus.  Neck: Normal range of motion. Neck supple. No JVD present. Carotid bruit is not present. No thyromegaly present.  Cardiovascular: Normal rate, regular  rhythm, normal heart sounds and intact distal pulses.  Exam reveals no gallop.   No murmur heard. Pulmonary/Chest: Effort normal and breath sounds normal. No respiratory distress. She has no wheezes.  Abdominal: Soft. Bowel sounds are normal. She exhibits no abdominal bruit.  Musculoskeletal: She exhibits no edema.  Lymphadenopathy:    She has no cervical adenopathy.  Neurological: She is alert. She has normal reflexes. No cranial nerve deficit.  Skin: Skin is warm and dry. No rash noted. No erythema. No pallor.  Psychiatric: She has a normal mood and affect.            Assessment & Plan:

## 2012-10-08 NOTE — Assessment & Plan Note (Signed)
Much improved with lisinopril hct and addn of metoprolol Headache resolved bp in fair control at this time  No changes needed  Disc lifstyle change with low sodium diet and exercise   F/u 6 mo  Labs for diuretic today

## 2012-10-10 ENCOUNTER — Other Ambulatory Visit: Payer: Self-pay

## 2012-10-10 LAB — BASIC METABOLIC PANEL
BUN: 11 mg/dL (ref 6–20)
CO2: 25 mmol/L (ref 19–28)
Chloride: 97 mmol/L (ref 97–108)
GFR calc Af Amer: 117 mL/min/{1.73_m2} (ref >59)
Glucose: 97 mg/dL (ref 65–99)

## 2012-10-10 MED ORDER — METOPROLOL SUCCINATE ER 50 MG PO TB24: 50.0000 mg | ORAL_TABLET | Freq: Every day | ORAL | Status: DC

## 2012-10-10 NOTE — Telephone Encounter (Signed)
Metoprolol  Was not phoned in to [facility]. Resent electronically. Pt notified while on phone.

## 2012-10-11 ENCOUNTER — Encounter: Payer: Self-pay | Admitting: *Deleted

## 2012-11-29 ENCOUNTER — Telehealth: Payer: Self-pay | Admitting: Obstetrics and Gynecology

## 2012-11-29 ENCOUNTER — Other Ambulatory Visit: Payer: Self-pay | Admitting: Obstetrics and Gynecology

## 2012-11-29 MED ORDER — VALACYCLOVIR HCL 500 MG PO TABS: ORAL_TABLET | ORAL | Status: DC

## 2012-11-29 NOTE — Telephone Encounter (Signed)
Tc to pt regarding msg, wanting rx filled by EP, lm on vm to call back.

## 2012-11-29 NOTE — Telephone Encounter (Signed)
Pt returned call, states is needing a refill of her Valtrex.  Pt advised needs an AEX scheduled, can give her refills until her next annual exam.  Pt requests it be sent thru her mail order pharmacy and is fine with waiting until rx comes in the mail.  Pt transferred to appointments for AEX scheduling, rx for Valtex E-prescribed to pt's mail order pharmacy.

## 2012-12-09 ENCOUNTER — Ambulatory Visit (INDEPENDENT_AMBULATORY_CARE_PROVIDER_SITE_OTHER): Payer: 59 | Admitting: Obstetrics and Gynecology

## 2012-12-09 ENCOUNTER — Encounter: Payer: Self-pay | Admitting: Obstetrics and Gynecology

## 2012-12-09 VITALS — BP 100/60 | Ht 62.5 in | Wt 186.0 lb

## 2012-12-09 DIAGNOSIS — Z1231 Encounter for screening mammogram for malignant neoplasm of breast: Secondary | ICD-10-CM

## 2012-12-09 DIAGNOSIS — Z124 Encounter for screening for malignant neoplasm of cervix: Secondary | ICD-10-CM

## 2012-12-09 DIAGNOSIS — Z1239 Encounter for other screening for malignant neoplasm of breast: Secondary | ICD-10-CM

## 2012-12-09 DIAGNOSIS — Z113 Encounter for screening for infections with a predominantly sexual mode of transmission: Secondary | ICD-10-CM

## 2012-12-09 DIAGNOSIS — N760 Acute vaginitis: Secondary | ICD-10-CM

## 2012-12-09 DIAGNOSIS — Z01419 Encounter for gynecological examination (general) (routine) without abnormal findings: Secondary | ICD-10-CM

## 2012-12-09 LAB — POCT WET PREP (WET MOUNT): pH: 4.5

## 2012-12-09 MED ORDER — VALACYCLOVIR HCL 500 MG PO TABS: ORAL_TABLET | ORAL | Status: DC

## 2012-12-09 NOTE — Progress Notes (Signed)
Last Pap:11/08/2011 WNL: Yes Regular Periods:Hysterectomy Contraception: Condoms  Monthly Breast exam:no Tetanus<59yrs:no Nl.Bladder Function:yes Daily BMs:yes Healthy Diet:yes Calcium:no Mammogram:no Date of Mammogram: n/a Exercise:yes Have often Exercise: twice weekly Seatbelt: yes Abuse at home: no Stressful work:no Sigmoid-colonoscopy: none Bone Density:n/a PCP: [facility]  Change in PMH: none  Change in ZOX:WRUE

## 2012-12-09 NOTE — Progress Notes (Signed)
Subjective:    [name] is a 36 y.o. female, G2P2, who presents for an annual exam. The patient reports some vaginal itching and would like to be checked for STDs.  Menstrual cycle:   LMP: Patient's last menstrual period was 05/09/2006.             Review of Systems Pertinent items are noted in HPI. Denies pelvic pain, urinary tract symptoms, vaginitis symptoms, irregular bleeding, menopausal symptoms, change in bowel habits or rectal bleeding   Objective:    BP 100/60  Ht 5' 2.5" (1.588 m)  Wt 186 lb (84.369 kg)  BMI 33.48 kg/m2  LMP 05/09/2006    Wt Readings from Last 1 Encounters:  12/09/12 186 lb (84.369 kg)   Body mass index is 33.48 kg/(m^2). General Appearance: Alert, no acute distress HEENT: Grossly normal Neck / Thyroid: Supple, no thyromegaly or cervical adenopathy Lungs: Clear to auscultation bilaterally Back: No CVA tenderness Breast Exam: No masses or nodes.No dimpling, nipple retraction or discharge. Cardiovascular: Regular rate and rhythm.  Gastrointestinal: Soft, non-tender, no masses or organomegaly Pelvic Exam: EGBUS-wnl, vagina-normal rugae, cervix- without lesions or tenderness, uterus appears normal size shape and consistency, adnexae-no masses or tenderness Rectovaginal: no masses and normal sphincter tone Lymphatic Exam: Non-palpable nodes in neck, clavicular,  axillary, or inguinal regions  Skin: no rashes or abnormalities Extremities: no clubbing cyanosis or edema  Neurologic: grossly normal Psychiatric: Alert and oriented  Assessment:   Routine GYN Exam   Plan:  Reviewed revised PAP smear guidelines for hysterectomized patients  PAP sent-patient request  Patient requests baseline mammogram  STD testing  [facility]  RTO 1 year or prn  Cordarius Benning,ELMIRAPA-C

## 2012-12-10 LAB — GC/CHLAMYDIA PROBE AMP: GC Probe RNA: NEGATIVE

## 2012-12-10 LAB — HIV ANTIBODY (ROUTINE TESTING W REFLEX): HIV: NONREACTIVE

## 2012-12-13 LAB — PAP IG W/ RFLX HPV ASCU

## 2013-01-07 ENCOUNTER — Ambulatory Visit
Admission: RE | Admit: 2013-01-07 | Discharge: 2013-01-07 | Disposition: A | Discharge status: Home / Self Care | Payer: 59 | Source: Ambulatory Visit | Attending: Obstetrics and Gynecology | Admitting: Obstetrics and Gynecology

## 2013-01-07 DIAGNOSIS — Z1231 Encounter for screening mammogram for malignant neoplasm of breast: Secondary | ICD-10-CM

## 2013-01-08 ENCOUNTER — Other Ambulatory Visit: Payer: Self-pay | Admitting: Obstetrics and Gynecology

## 2013-01-08 DIAGNOSIS — R921 Mammographic calcification found on diagnostic imaging of breast: Secondary | ICD-10-CM

## 2013-01-10 ENCOUNTER — Other Ambulatory Visit: Payer: Self-pay | Admitting: Obstetrics and Gynecology

## 2013-01-10 DIAGNOSIS — R921 Mammographic calcification found on diagnostic imaging of breast: Secondary | ICD-10-CM

## 2013-01-23 ENCOUNTER — Other Ambulatory Visit: Payer: 59

## 2013-01-27 ENCOUNTER — Ambulatory Visit (INDEPENDENT_AMBULATORY_CARE_PROVIDER_SITE_OTHER): Payer: 59 | Admitting: Family Medicine

## 2013-01-27 ENCOUNTER — Encounter: Payer: Self-pay | Admitting: Family Medicine

## 2013-01-27 VITALS — BP 114/68 | HR 74 | Temp 97.5°F | Ht 62.5 in | Wt 184.2 lb

## 2013-01-27 DIAGNOSIS — I10 Essential (primary) hypertension: Secondary | ICD-10-CM

## 2013-01-27 DIAGNOSIS — R05 Cough: Secondary | ICD-10-CM

## 2013-01-27 DIAGNOSIS — T464X5A Adverse effect of angiotensin-converting-enzyme inhibitors, initial encounter: Secondary | ICD-10-CM

## 2013-01-27 DIAGNOSIS — T44905A Adverse effect of unspecified drugs primarily affecting the autonomic nervous system, initial encounter: Secondary | ICD-10-CM

## 2013-01-27 MED ORDER — LOSARTAN POTASSIUM-HCTZ 50-12.5 MG PO TABS: 1.0000 | ORAL_TABLET | Freq: Every day | ORAL | Status: DC

## 2013-01-27 NOTE — Assessment & Plan Note (Signed)
Will change from lisinopril hct to losartan hct due to cough  Will update if not imp

## 2013-01-27 NOTE — Progress Notes (Signed)
Subjective:    Patient ID: [name], female    DOB: October 01, 1976, 37 y.o.   MRN: [id number]  HPI Here for cough  She is coughing late at night and early in am  Has coughing fits and has to sit up to catch her breath  No heartburn or acid reflux symptoms   No runny or stuffy nose and not sick lately   Allergies are not really bothering her   She does clear her throat frequently    Patient Active Problem List  Diagnosis  . OTHER NONTHROMBOCYTOPENIC PURPURAS  . SINUSITIS- ACUTE-NOS  . ALLERGIC RHINITIS  . GERD  . BACTERIAL VAGINITIS  . HERNIATED LUMBAR DISC  . LOW BACK PAIN, ACUTE  . LEG PAIN, LEFT  . DYSURIA  . Essential hypertension  . Hyperglycemia  . Headache   Past Medical History  Diagnosis Date  . Allergic rhinitis     seasonal  . GERD (gastroesophageal reflux disease)   . Hypertension   . Abnormal Pap smear     cin-1  . 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 Colposcopy 1997    abnormal pap  . Partial hysterectomy 11/2005    bleeding  . Laminectomy and microdiscectomy lumbar spine 12/2007    L5-S1  . Wisdom tooth extraction   . Cervix lesion destruction    History  Substance Use Topics  . Smoking status: Never Smoker   . Smokeless tobacco: Not on file  . Alcohol Use: Yes     Comment: social   Family History  Problem Relation Age of Onset  . Hypertension Mother 51  . Diabetes Paternal Grandfather   . Breast cancer Maternal Grandmother   . Colon cancer Other     MGaunt  . Stroke Paternal Grandmother   . Hypertension Father    Allergies  Allergen Reactions  . Morphine     REACTION: skin crawling  . Rofecoxib     REACTION: blurred vision, dizziness   Current Outpatient Prescriptions on File Prior to Visit  Medication Sig Dispense Refill  . lisinopril-hydrochlorothiazide (PRINZIDE,ZESTORETIC) 10-12.5 MG per tablet Take 1 tablet by mouth daily.  90 tablet  3  .  metoprolol succinate (TOPROL-XL) 50 MG 24 hr tablet Take 1 tablet (50 mg total) by mouth daily. Take with or immediately following a meal.  90 tablet  3  . valACYclovir (VALTREX) 500 MG tablet Take 1 tablet po QD or BID x 3 days prn  90 tablet  4  . fluticasone (FLONASE) 50 MCG/ACT nasal spray 2 sprays by Nasal route daily.  16 g  0    Review of Systems Review of Systems  Constitutional: Negative for fever, appetite change, fatigue and unexpected weight change.  ENT neg for post nasal drip / rhinorrhea lately Eyes: Negative for pain and visual disturbance.  Respiratory: Negative for wheeze and shortness of breath.  neg for phlegm prod with cough Cardiovascular: Negative for cp or palpitations    Gastrointestinal: Negative for nausea, diarrhea and constipation.  Genitourinary: Negative for urgency and frequency.  Skin: Negative for pallor or rash   Neurological: Negative for weakness, light-headedness, numbness and headaches.  Hematological: Negative for adenopathy. Does not bruise/bleed easily.  Psychiatric/Behavioral: Negative for dysphoric mood. The patient is not nervous/anxious.         Objective:   Physical Exam  Constitutional: She appears well-developed and well-nourished. No distress.  obese and well appearing   HENT:  Head: Normocephalic and atraumatic.  Right Ear: External ear normal.  Left Ear: External ear normal.  Mouth/Throat: Oropharynx is clear and moist. No oropharyngeal exudate.       Nares are boggy but clear   Eyes: Conjunctivae normal and EOM are normal. Pupils are equal, round, and reactive to light. Right eye exhibits no discharge. Left eye exhibits no discharge. No scleral icterus.  Neck: Neck supple.  Cardiovascular: Normal rate and regular rhythm.   Pulmonary/Chest: Effort normal and breath sounds normal. No respiratory distress. She has no wheezes. She has no rales.  Abdominal: Soft. Bowel sounds are normal. She exhibits no distension. There is no  tenderness.  Musculoskeletal: She exhibits no edema.  Lymphadenopathy:    She has no cervical adenopathy.  Neurological: She is alert. She has normal reflexes. No cranial nerve deficit. She exhibits normal muscle tone. Coordination normal.  Skin: Skin is warm and dry. No rash noted.  Psychiatric: She has a normal mood and affect.          Assessment & Plan:

## 2013-01-27 NOTE — Patient Instructions (Addendum)
Stop the lisinopril hct since it may cause your cough Change to losartan hct  If side effects/ problems/ or if cough continues - let me know  Also avoiding eating right before bed

## 2013-01-27 NOTE — Assessment & Plan Note (Signed)
Change to losartan hct  Will update if no imp - if that is the case may need to look into poss of gerd as etiology  bp is well controlled Reassuring exam

## 2013-03-20 ENCOUNTER — Other Ambulatory Visit: Payer: Self-pay

## 2013-03-20 MED ORDER — LOSARTAN POTASSIUM-HCTZ 50-12.5 MG PO TABS: 1.0000 | ORAL_TABLET | Freq: Every day | ORAL | Status: DC

## 2013-03-20 NOTE — Telephone Encounter (Signed)
Pt left note requesting refill losartan-HCTZ to [facility] rx # 90 x 3. [facility] [address] notified to cancel rx 01/27/13.unable to reach pt by phone to verify done.

## 2013-03-20 NOTE — Telephone Encounter (Signed)
Pt called to ck on status of refill; advised pt done as requested.

## 2013-04-08 ENCOUNTER — Ambulatory Visit: Payer: 59 | Admitting: Family Medicine

## 2013-04-09 ENCOUNTER — Ambulatory Visit (INDEPENDENT_AMBULATORY_CARE_PROVIDER_SITE_OTHER): Payer: 59 | Admitting: Family Medicine

## 2013-04-09 ENCOUNTER — Encounter: Payer: Self-pay | Admitting: Family Medicine

## 2013-04-09 VITALS — BP 108/58 | HR 77 | Temp 98.7°F | Ht 62.5 in | Wt 186.2 lb

## 2013-04-09 DIAGNOSIS — I10 Essential (primary) hypertension: Secondary | ICD-10-CM

## 2013-04-09 NOTE — Assessment & Plan Note (Signed)
Doing very well with metoprolol (also helps headaches) and losartan hct No more cough  Lifestyle habits are getting better-pt is very motivated to loose wt Outlined a plan for that  F/u 6 mo annual exam with labs prior If hypotensive will update me so we can cut back medicine

## 2013-04-09 NOTE — Patient Instructions (Addendum)
Blood pressure is wonderful  Continue current medicines  Work hard on diet and exercise for weight loss  If your blood pressure goes down to 80/50 or below or if you start getting dizzy- call and we will cut medicine  For weight loss - consider joining weight watchers or try the free app for My Fitness Pal Follow up in 6 months for annual exam with labs prior

## 2013-04-09 NOTE — Progress Notes (Signed)
Subjective:    Patient ID: [name], female    DOB: January 08, 1976, 37 y.o.   MRN: [id number]  HPI Is doing better - here for HTN f/u   bp is stable today  No cp or palpitations or headaches or edema  No side effects to medicines  BP Readings from Last 3 Encounters:  04/09/13 108/58  01/27/13 114/68  12/09/12 100/60     Not dizzy , feels good  No cough off of the ace inhibitor   Started to go back to the gym - to loose weight - zumba on mon and wednesdays and uses elliptical- and uses weights  No headaches   Exercise helps mood also  Gained weight - so doing better with  She is motivated  New program at work - "new you"- is excited  Step one is drinking lots of water   Patient Active Problem List  Diagnosis  . OTHER NONTHROMBOCYTOPENIC PURPURAS  . ALLERGIC RHINITIS  . GERD  . HERNIATED LUMBAR DISC  . LOW BACK PAIN, ACUTE  . LEG PAIN, LEFT  . Essential hypertension  . Headache   Past Medical History  Diagnosis Date  . Allergic rhinitis     seasonal  . GERD (gastroesophageal reflux disease)   . Hypertension   . Abnormal Pap smear     cin-1  . 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 Colposcopy  1997    abnormal pap  . Partial hysterectomy  11/2005    bleeding  . Laminectomy and microdiscectomy lumbar spine  12/2007    L5-S1  . Wisdom tooth extraction    . Cervix lesion destruction     History  Substance Use Topics  . Smoking status: Never Smoker   . Smokeless tobacco: Not on file  . Alcohol Use: Yes     Comment: social   Family History  Problem Relation Age of Onset  . Hypertension Mother 67  . Diabetes Paternal Grandfather   . Breast cancer Maternal Grandmother   . Colon cancer Other     MGaunt  . Stroke Paternal Grandmother   . Hypertension Father    Allergies  Allergen Reactions  . Lisinopril     Cough   . Morphine     REACTION: skin crawling  . Rofecoxib      REACTION: blurred vision, dizziness   Current Outpatient Prescriptions on File Prior to Visit  Medication Sig Dispense Refill  . losartan-hydrochlorothiazide (HYZAAR) 50-12.5 MG per tablet Take 1 tablet by mouth daily.  90 tablet  3  . metoprolol succinate (TOPROL-XL) 50 MG 24 hr tablet Take 1 tablet (50 mg total) by mouth daily. Take with or immediately following a meal.  90 tablet  3  . valACYclovir (VALTREX) 500 MG tablet Take 1 tablet po QD or BID x 3 days prn  90 tablet  4  . fluticasone (FLONASE) 50 MCG/ACT nasal spray 2 sprays by Nasal route daily.  16 g  0   No current facility-administered medications on file prior to visit.    Review of Systems Review of Systems  Constitutional: Negative for fever, appetite change, fatigue and unexpected weight change.  Eyes: Negative for pain and visual disturbance.  Respiratory: Negative for cough and shortness of breath.   Cardiovascular: Negative for cp or palpitations    Gastrointestinal: Negative for nausea, diarrhea and constipation.  Genitourinary: Negative for urgency and frequency.  Skin:  Negative for pallor or rash   Neurological: Negative for weakness, light-headedness, numbness and headaches.  Hematological: Negative for adenopathy. Does not bruise/bleed easily.  Psychiatric/Behavioral: Negative for dysphoric mood. The patient is not nervous/anxious.         Objective:   Physical Exam  Constitutional: She appears well-developed and well-nourished. No distress.  obese and well appearing   HENT:  Head: Normocephalic and atraumatic.  Mouth/Throat: Oropharynx is clear and moist.  Eyes: Conjunctivae and EOM are normal. Pupils are equal, round, and reactive to light. No scleral icterus.  Neck: Normal range of motion. Neck supple. No JVD present. Carotid bruit is not present. No thyromegaly present.  Cardiovascular: Normal rate, regular rhythm, normal heart sounds and intact distal pulses.  Exam reveals no gallop.    Pulmonary/Chest: Effort normal and breath sounds normal. No respiratory distress. She has no wheezes.  Musculoskeletal: She exhibits no edema.  Lymphadenopathy:    She has no cervical adenopathy.  Neurological: She is alert. She has normal reflexes. No cranial nerve deficit. She exhibits normal muscle tone. Coordination normal.  Skin: Skin is dry. No rash noted. No erythema. No pallor.  Psychiatric: She has a normal mood and affect.          Assessment & Plan:

## 2013-05-08 ENCOUNTER — Other Ambulatory Visit: Payer: Self-pay

## 2013-05-08 MED ORDER — METOPROLOL SUCCINATE ER 50 MG PO TB24: 50.0000 mg | ORAL_TABLET | Freq: Every day | ORAL | Status: DC

## 2013-05-08 NOTE — Telephone Encounter (Signed)
Pt out of metoprolol; pt waiting on mail order delivery and going out of town next week.pt request # [phone] to [employer]; advised pt done.

## 2013-06-02 ENCOUNTER — Encounter: Payer: Self-pay | Admitting: Family Medicine

## 2013-06-02 ENCOUNTER — Ambulatory Visit (INDEPENDENT_AMBULATORY_CARE_PROVIDER_SITE_OTHER): Payer: 59 | Admitting: Family Medicine

## 2013-06-02 VITALS — BP 102/68 | HR 64 | Temp 98.7°F | Ht 62.5 in | Wt 183.2 lb

## 2013-06-02 DIAGNOSIS — M62838 Other muscle spasm: Secondary | ICD-10-CM | POA: Insufficient documentation

## 2013-06-02 MED ORDER — CYCLOBENZAPRINE HCL 10 MG PO TABS: 10.0000 mg | ORAL_TABLET | Freq: Three times a day (TID) | ORAL | Status: DC | PRN

## 2013-06-02 NOTE — Assessment & Plan Note (Signed)
Pain medial to R scapula and in trapezius area Disc use of heat/ stretching and cervical supp pillow Given px for flexeril to use with caution  Also ok to take ibuprofen 800 up to tid with food Update if not starting to improve in a week or if worsening

## 2013-06-02 NOTE — Patient Instructions (Addendum)
Use some heat on the painful area to relax muscles - or icy hot/ muscle rub  You can take up to 800 mg of ibuprofen with food up to three times day Use flexeril at night or when not working or driving ( it can sedate - is a muscle relaxer)  Also a cervical support pillow made of foam may help Update if not starting to improve in a week or two or if worsening

## 2013-06-02 NOTE — Progress Notes (Signed)
Subjective:    Patient ID: [name], female    DOB: 03-23-1976, 37 y.o.   MRN: [id number]  HPI Here with R sided neck and shoulder pain  She lifts wts intermittently at the gym- not a lot lately No particular injury  Tight and achey- R side of neck and her shoulder hurts in the back - and clicks when she moves her shoulder Some pain rad down the back side of arm  Grip strength Took one of her sister's muscle relaxer- flexeril and it did help (esp sleep)   Patient Active Problem List   Diagnosis Date Noted  . Essential hypertension 09/16/2012  . Headache 09/16/2012  . LOW BACK PAIN, ACUTE 05/21/2008  . 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 LEG PAIN, LEFT 12/30/2007  . OTHER NONTHROMBOCYTOPENIC PURPURAS 12/04/2007  . ALLERGIC RHINITIS 07/02/2007  . GERD 07/02/2007   Past Medical History  Diagnosis Date  . Allergic rhinitis     seasonal  . GERD (gastroesophageal reflux disease)   . Hypertension   . Abnormal Pap smear     cin-1  . 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 Colposcopy  1997    abnormal pap  . Partial hysterectomy  11/2005    bleeding  . Laminectomy and microdiscectomy lumbar spine  12/2007    L5-S1  . Wisdom tooth extraction    . Cervix lesion destruction     History  Substance Use Topics  . Smoking status: Never Smoker   . Smokeless tobacco: Not on file  . Alcohol Use: Yes     Comment: social   Family History  Problem Relation Age of Onset  . Hypertension Mother 18  . Diabetes Paternal Grandfather   . Breast cancer Maternal Grandmother   . Colon cancer Other     MGaunt  . Stroke Paternal Grandmother   . Hypertension Father    Allergies  Allergen Reactions  . Lisinopril     Cough   . Morphine     REACTION: skin crawling  . Rofecoxib     REACTION: blurred vision, dizziness   Current Outpatient Prescriptions on File Prior to Visit  Medication Sig Dispense  Refill  . fluticasone (FLONASE) 50 MCG/ACT nasal spray 2 sprays by Nasal route daily.  16 g  0  . losartan-hydrochlorothiazide (HYZAAR) 50-12.5 MG per tablet Take 1 tablet by mouth daily.  90 tablet  3  . metoprolol succinate (TOPROL-XL) 50 MG 24 hr tablet Take 1 tablet (50 mg total) by mouth daily. Take with or immediately following a meal.  30 tablet  0  . valACYclovir (VALTREX) 500 MG tablet Take 1 tablet po QD or BID x 3 days prn  90 tablet  4   No current facility-administered medications on file prior to visit.    Review of Systems Review of Systems  Constitutional: Negative for fever, appetite change, fatigue and unexpected weight change.  Eyes: Negative for pain and visual disturbance.  Respiratory: Negative for cough and shortness of breath.   Cardiovascular: Negative for cp or palpitations    Gastrointestinal: Negative for nausea, diarrhea and constipation.  Genitourinary: Negative for urgency and frequency.  Skin: Negative for pallor or rash   Neurological: Negative for weakness, light-headedness, numbness and headaches.  Hematological: Negative for adenopathy. Does not bruise/bleed easily.  Psychiatric/Behavioral: Negative for dysphoric mood. The patient is not nervous/anxious.  Objective:   Physical Exam  Constitutional: She appears well-developed and well-nourished. No distress.  HENT:  Head: Normocephalic and atraumatic.  Eyes: Conjunctivae and EOM are normal. Pupils are equal, round, and reactive to light. No scleral icterus.  Neck: Normal range of motion. Neck supple. No JVD present. No thyromegaly present.  Nl rom neck Some pain on tilt and rotation to the left No bony tenderness Pos for R muscular tenderness  Cardiovascular: Normal rate and regular rhythm.   Pulmonary/Chest: Effort normal and breath sounds normal.  Musculoskeletal: She exhibits tenderness. She exhibits no edema.  Tender medial to R scapula and also R trapezius No crepitus Nl rom UE and  shoulders Neg hawking and neer tests   Lymphadenopathy:    She has no cervical adenopathy.  Neurological: She is alert. She has normal strength and normal reflexes. She displays no atrophy and no tremor. No sensory deficit. Coordination normal.  Nl grip    Skin: Skin is warm and dry. No rash noted. No erythema. No pallor.  Psychiatric: She has a normal mood and affect.          Assessment & Plan:

## 2013-09-25 ENCOUNTER — Telehealth: Payer: Self-pay | Admitting: Family Medicine

## 2013-09-25 DIAGNOSIS — Z0000 Encounter for general adult medical examination without abnormal findings: Secondary | ICD-10-CM

## 2013-09-25 NOTE — Telephone Encounter (Signed)
Left message for pt to return call.

## 2013-09-25 NOTE — Telephone Encounter (Signed)
Let her know I can actually order it electronically here and something will print out - I'm not sure if she will need the paper but best take it with her anyway Send this back to me and I will do it tomorrow when I'm back in the office

## 2013-09-25 NOTE — Telephone Encounter (Signed)
Pt is coming on 10/17/2013 for her CPE.  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  She wants to know if you can mail her the written order so she can take it w/her for the lab work? Thank you.

## 2013-09-25 NOTE — Telephone Encounter (Signed)
Pt is fine with that.  She can pick up if needed.

## 2013-09-26 DIAGNOSIS — Z0000 Encounter for general adult medical examination without abnormal findings: Secondary | ICD-10-CM | POA: Insufficient documentation

## 2013-09-26 DIAGNOSIS — Z0001 Encounter for general adult medical examination with abnormal findings: Secondary | ICD-10-CM | POA: Insufficient documentation

## 2013-09-26 NOTE — Telephone Encounter (Signed)
Will do - I will put printouts in IN box

## 2013-09-26 NOTE — Telephone Encounter (Signed)
Faxed orders to pt, per pt request

## 2013-10-11 LAB — CBC WITH DIFFERENTIAL/PLATELET
Eos: 3 %
Immature Grans (Abs): 0 10*3/uL (ref 0.0–0.1)
Immature Granulocytes: 0 %
MCV: 90 fL (ref 79–97)
Monocytes Absolute: 0.5 10*3/uL (ref 0.1–0.9)
Neutrophils Relative %: 59 %
RDW: 13.3 % (ref 12.3–15.4)
WBC: 5.9 10*3/uL (ref 3.4–10.8)

## 2013-10-11 LAB — COMPREHENSIVE METABOLIC PANEL
ALT: 12 IU/L (ref 0–32)
AST: 13 IU/L (ref 0–40)
CO2: 24 mmol/L (ref 18–29)
Calcium: 9.4 mg/dL (ref 8.7–10.2)
Chloride: 98 mmol/L (ref 97–108)
Creatinine, Ser: 0.81 mg/dL (ref 0.57–1.00)
Globulin, Total: 2.2 g/dL (ref 1.5–4.5)
Potassium: 4.4 mmol/L (ref 3.5–5.2)
Sodium: 139 mmol/L (ref 134–144)

## 2013-10-11 LAB — LIPID PANEL
Chol/HDL Ratio: 2.7 ratio units (ref 0.0–4.4)
Cholesterol, Total: 149 mg/dL (ref 100–199)
LDL Calculated: 81 mg/dL (ref 0–99)
Triglycerides: 64 mg/dL (ref 0–149)
VLDL Cholesterol Cal: 13 mg/dL (ref 5–40)

## 2013-10-11 LAB — TSH: TSH: 1.41 u[IU]/mL (ref 0.450–4.500)

## 2013-10-17 ENCOUNTER — Encounter: Payer: Self-pay | Admitting: Family Medicine

## 2013-10-17 ENCOUNTER — Ambulatory Visit (INDEPENDENT_AMBULATORY_CARE_PROVIDER_SITE_OTHER): Payer: 59 | Admitting: Family Medicine

## 2013-10-17 VITALS — BP 112/74 | HR 96 | Temp 99.0°F | Ht 62.75 in | Wt 169.2 lb

## 2013-10-17 DIAGNOSIS — I10 Essential (primary) hypertension: Secondary | ICD-10-CM

## 2013-10-17 DIAGNOSIS — Z0000 Encounter for general adult medical examination without abnormal findings: Secondary | ICD-10-CM

## 2013-10-17 NOTE — Progress Notes (Signed)
Subjective:    Patient ID: [name], female    DOB: 13-May-1976, 37 y.o.   MRN: [id number]  HPI  Here for health maintenance exam and to review chronic medical problems    Wt is down 14 lb from her last visit here - and is seeing an obgyn who specializes in wt loss  [facility]  In [place]  Phentermine and better meals with very small portions - doing well with that - she can fit into her plan  They give B12 shots and also watch bp closely For exercise - goes to [employer]- elliptical and wts  Going into her 2nd mo   bp is not affected by the phentermine  Has been off the metoprolol for a while -no headaches   Flu vaccine - had it at work  Td 08   Pap 12/13-  Hx of partial hyst --unsure how often she will have a pap -- gyn is Dr Perfecto [place] not having peroids  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   bp is stable today  No cp or palpitations or headaches or edema  No side effects to medicines  BP Readings from Last 3 Encounters:  10/17/13 112/74  06/02/13 102/68  04/09/13 108/58       Chemistry      Component Value Date/Time   NA 139 10/10/2013 0828   K 4.4 10/10/2013 0828   CL 98 10/10/2013 0828   CO2 24 10/10/2013 0828   BUN 9 10/10/2013 0828   CREATININE 0.81 10/10/2013 0828      Component Value Date/Time   CALCIUM 9.4 10/10/2013 0828   ALKPHOS 42 10/10/2013 0828   AST 13 10/10/2013 0828   ALT 12 10/10/2013 0828   BILITOT 0.8 10/10/2013 0828      Lab Results  Component Value Date   WBC 5.9 10/10/2013   HGB 13.7 10/10/2013   HCT 41.0 10/10/2013   MCV 90 10/10/2013   PLT 191 01/22/2008    Lab Results  Component Value Date   TSH 1.410 10/10/2013    No results found for this basename: CHOL   Lab Results  Component Value Date   HDL 55 10/10/2013   HDL 68 09/16/2012   Lab Results  Component Value Date   LDLCALC 81 10/10/2013   LDLCALC 86 09/16/2012   Lab Results  Component Value Date   TRIG 64 10/10/2013   TRIG  72 09/16/2012   Lab Results  Component Value Date   CHOLHDL 2.7 10/10/2013   No results found for this basename: LDLDIRECT   good profile   Sugar 93  Patient Active Problem List   Diagnosis Date Noted  . Routine general medical examination at a health care facility 09/26/2013  . Neck muscle spasm 06/02/2013  . Essential hypertension 09/16/2012  . Headache 09/16/2012  . LOW BACK PAIN, ACUTE 05/21/2008  . 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 LEG PAIN, LEFT 12/30/2007  . OTHER NONTHROMBOCYTOPENIC PURPURAS 12/04/2007  . ALLERGIC RHINITIS 07/02/2007  . GERD 07/02/2007   Past Medical History  Diagnosis Date  . Allergic rhinitis     seasonal  . GERD (gastroesophageal reflux disease)   . Hypertension   . Abnormal Pap smear     cin-1  . 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 Colposcopy  1997    abnormal pap  . Partial hysterectomy  11/2005    bleeding  . Laminectomy and microdiscectomy lumbar spine  12/2007    L5-S1  . Wisdom tooth extraction    . Cervix lesion destruction     History  Substance Use Topics  . Smoking status: Never Smoker   . Smokeless tobacco: Not on file  . Alcohol Use: Yes     Comment: social   Family History  Problem Relation Age of Onset  . Hypertension Mother 38  . Diabetes Paternal Grandfather   . Breast cancer Maternal Grandmother   . Colon cancer Other     MGaunt  . Stroke Paternal Grandmother   . Hypertension Father    Allergies  Allergen Reactions  . Lisinopril     Cough   . Morphine     REACTION: skin crawling  . Rofecoxib     REACTION: blurred vision, dizziness   Current Outpatient Prescriptions on File Prior to Visit  Medication Sig Dispense Refill  . cyclobenzaprine (FLEXERIL) 10 MG tablet Take 1 tablet (10 mg total) by mouth 3 (three) times daily as needed for muscle spasms (caution this may sedate).  30 tablet  0  . fluticasone (FLONASE) 50 MCG/ACT  nasal spray 2 sprays by Nasal route daily.  16 g  0  . losartan-hydrochlorothiazide (HYZAAR) 50-12.5 MG per tablet Take 1 tablet by mouth daily.  90 tablet  3  . metoprolol succinate (TOPROL-XL) 50 MG 24 hr tablet Take 1 tablet (50 mg total) by mouth daily. Take with or immediately following a meal.  30 tablet  0  . Multiple Vitamin (MULTIVITAMIN) capsule Take 1 capsule by mouth daily.      . valACYclovir (VALTREX) 500 MG tablet Take 1 tablet po QD or BID x 3 days prn  90 tablet  4   No current facility-administered medications on file prior to visit.     Review of Systems     Objective:   Physical Exam  Constitutional: She appears well-developed and well-nourished. No distress.  overwt and well appearing   HENT:  Head: Normocephalic and atraumatic.  Right Ear: External ear normal.  Left Ear: External ear normal.  Nose: Nose normal.  Mouth/Throat: Oropharynx is clear and moist.  Eyes: Conjunctivae and EOM are normal. Pupils are equal, round, and reactive to light. Right eye exhibits no discharge. Left eye exhibits no discharge. No scleral icterus.  Neck: Normal range of motion. Neck supple. No JVD present. No thyromegaly present.  Cardiovascular: Normal rate, regular rhythm, normal heart sounds and intact distal pulses.  Exam reveals no gallop.   Pulmonary/Chest: Effort normal and breath sounds normal. No respiratory distress. She has no wheezes. She has no rales.  Abdominal: Soft. Bowel sounds are normal. She exhibits no distension and no mass. There is no tenderness.  Musculoskeletal: She exhibits no edema and no tenderness.  Lymphadenopathy:    She has no cervical adenopathy.  Neurological: She is alert. She has normal reflexes. No cranial nerve deficit. She exhibits normal muscle tone. Coordination normal.  Skin: Skin is warm and dry. No rash noted. No erythema. No pallor.  Psychiatric: She has a normal mood and affect.          Assessment & Plan:

## 2013-10-17 NOTE — Patient Instructions (Signed)
Try to get 1200-1500 mg of calcium per day with at least 1000 iu of vitamin D - for bone health - you can find this over the counter  I'm glad you had your flu shot  Blood pressure is ok - watch this with phentermine If your ears pop on airplane- get some afrin and use it as directed for take off and landing  Labs look ok  See gyn as planned

## 2013-10-19 NOTE — Assessment & Plan Note (Signed)
bp in fair control at this time  No changes needed  Disc lifstyle change with low sodium diet and exercise  Lab rev  Will watch closely at her wt loss clinic on phentermine

## 2013-10-19 NOTE — Assessment & Plan Note (Signed)
Reviewed health habits including diet and exercise and skin cancer prevention Also reviewed health mt list, fam hx and immunizations  Wellness labs rev  Has had her flu shot Enc further exercise

## 2014-01-28 ENCOUNTER — Other Ambulatory Visit: Payer: Self-pay | Admitting: Family Medicine

## 2014-03-31 ENCOUNTER — Telehealth: Payer: Self-pay

## 2014-03-31 NOTE — Telephone Encounter (Signed)
Pt is no longer taking phentermine or metoprolol; pt continues to take losartan HCTZ and pt wants to know what her BP should be; for 1-1 1/2 months pt experiences lightheadedness at least once a week. No H/A, SOB or CP. Pt has not had BP checked since seen 09/2013. Pt request cb.

## 2014-03-31 NOTE — Telephone Encounter (Signed)
Spoke with patient and advised results  Pt will call for appt with Dr. Milinda Antisower

## 2014-03-31 NOTE — Telephone Encounter (Signed)
The goal blood pressure is 120/80--though this is debatable. Action is usually recommended for BP over 140/90 It is possible that her symptoms are due to the BP being too low Please leave for Dr Milinda Antisower to review upon her return

## 2014-05-14 ENCOUNTER — Other Ambulatory Visit: Payer: Self-pay | Admitting: Obstetrics and Gynecology

## 2014-05-14 DIAGNOSIS — R921 Mammographic calcification found on diagnostic imaging of breast: Secondary | ICD-10-CM

## 2014-06-02 ENCOUNTER — Ambulatory Visit
Admission: RE | Admit: 2014-06-02 | Discharge: 2014-06-02 | Disposition: A | Discharge status: Home / Self Care | Payer: 59 | Source: Ambulatory Visit | Attending: Obstetrics and Gynecology | Admitting: Obstetrics and Gynecology

## 2014-06-02 DIAGNOSIS — R921 Mammographic calcification found on diagnostic imaging of breast: Secondary | ICD-10-CM

## 2014-09-07 ENCOUNTER — Ambulatory Visit (INDEPENDENT_AMBULATORY_CARE_PROVIDER_SITE_OTHER): Payer: 59 | Admitting: Family Medicine

## 2014-09-07 ENCOUNTER — Encounter: Payer: Self-pay | Admitting: Family Medicine

## 2014-09-07 VITALS — BP 112/72 | HR 65 | Temp 98.5°F | Ht 62.75 in | Wt 163.5 lb

## 2014-09-07 DIAGNOSIS — IMO0001 Reserved for inherently not codable concepts without codable children: Secondary | ICD-10-CM

## 2014-09-07 DIAGNOSIS — M7918 Myalgia, other site: Secondary | ICD-10-CM | POA: Insufficient documentation

## 2014-09-07 DIAGNOSIS — S43499A Other sprain of unspecified shoulder joint, initial encounter: Secondary | ICD-10-CM

## 2014-09-07 DIAGNOSIS — S46811A Strain of other muscles, fascia and tendons at shoulder and upper arm level, right arm, initial encounter: Secondary | ICD-10-CM

## 2014-09-07 DIAGNOSIS — S46819A Strain of other muscles, fascia and tendons at shoulder and upper arm level, unspecified arm, initial encounter: Secondary | ICD-10-CM | POA: Insufficient documentation

## 2014-09-07 MED ORDER — CYCLOBENZAPRINE HCL 10 MG PO TABS: 10.0000 mg | ORAL_TABLET | Freq: Three times a day (TID) | ORAL | Status: DC | PRN

## 2014-09-07 NOTE — Assessment & Plan Note (Signed)
On R in pt with prev lumbar surg Fairly nl exam and rom  Taught stretch of piriformis to do as needed , also handout with more stretches Recommend heat 10 min at a time Flexeril prn (sedation precaution) Update if not starting to improve in a week or if worsening

## 2014-09-07 NOTE — Patient Instructions (Signed)
I think you have a trapezius muscle strain near shoulder Use heat  Flexeril may help- careful of sedation  Use a cervical support pillow if possible to support neck  I think you also have a piriformis muscle strain (near hip)  Keep stretching (the stretch I taught you) Flexeril as needed and heat   If not improving or worse in the next 2 weeks let me know

## 2014-09-07 NOTE — Assessment & Plan Note (Signed)
On R  Hx of prev neck problems No neuro s/s Recommend heat/ cervical support pillow and flexeril prn (with risk of sedation) Update if not starting to improve in a week or if worsening

## 2014-09-07 NOTE — Progress Notes (Signed)
Subjective:    Patient ID: [name], female    DOB: 02-27-1976, 38 y.o.   MRN: [id number]  HPI Here with pain in R hip and down R leg   This is different pain than when she had ruptured disc (surgery) Is on the outside of leg  None in buttock or back for the most part   (occ lower back pain) No numbness or weakness in leg  Usually a sharp pain - has to change position  Worst with driving or prolonged sitting  No comfortable position - but it helps to stand after sitting or with re position in bed  Used ibuprofen - not a lot of help   Also some pain in R shoulder going down her arm  This just started recently  Not as bad as the leg  No particular triggers   Patient Active Problem List   Diagnosis Date Noted  . Routine general medical examination at a health care facility 09/26/2013  . Neck muscle spasm 06/02/2013  . Essential hypertension 09/16/2012  . Headache 09/16/2012  . LOW BACK PAIN, ACUTE 05/21/2008  . 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 LEG PAIN, LEFT 12/30/2007  . OTHER NONTHROMBOCYTOPENIC PURPURAS 12/04/2007  . ALLERGIC RHINITIS 07/02/2007  . GERD 07/02/2007   Past Medical History  Diagnosis Date  . Allergic rhinitis     seasonal  . GERD (gastroesophageal reflux disease)   . Hypertension   . Abnormal Pap smear     cin-1  . 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 Colposcopy  1997    abnormal pap  . Partial hysterectomy  11/2005    bleeding  . Laminectomy and microdiscectomy lumbar spine  12/2007    L5-S1  . Wisdom tooth extraction    . Cervix lesion destruction     History  Substance Use Topics  . Smoking status: Never Smoker   . Smokeless tobacco: Not on file  . Alcohol Use: Yes     Comment: social   Family History  Problem Relation Age of Onset  . Hypertension Mother 21  . Diabetes Paternal Grandfather   . Breast cancer Maternal Grandmother   . Colon  cancer Other     MGaunt  . Stroke Paternal Grandmother   . Hypertension Father    Allergies  Allergen Reactions  . Lisinopril     Cough   . Morphine     REACTION: skin crawling  . Rofecoxib     REACTION: blurred vision, dizziness   Current Outpatient Prescriptions on File Prior to Visit  Medication Sig Dispense Refill  . fluticasone (FLONASE) 50 MCG/ACT nasal spray 2 sprays by Nasal route daily.  16 g  0  . losartan-hydrochlorothiazide (HYZAAR) 50-12.5 MG per tablet Take 1 tablet by mouth  daily  90 tablet  1  . Multiple Vitamin (MULTIVITAMIN) capsule Take 1 capsule by mouth daily.      . valACYclovir (VALTREX) 500 MG tablet Take 1 tablet po QD or BID x 3 days prn  90 tablet  4   No current facility-administered medications on file prior to visit.      Review of Systems    Review of Systems  Constitutional: Negative for fever, appetite change, fatigue and unexpected weight change.  Eyes: Negative for pain and visual disturbance.  Respiratory: Negative for cough and shortness of breath.   Cardiovascular: Negative for cp or palpitations  Gastrointestinal: Negative for nausea, diarrhea and constipation.  Genitourinary: Negative for urgency and frequency.  Skin: Negative for pallor or rash   MSK pos for neck/shoulder pain and hip area pain  Neurological: Negative for weakness, light-headedness, numbness and headaches.  Hematological: Negative for adenopathy. Does not bruise/bleed easily.  Psychiatric/Behavioral: Negative for dysphoric mood. The patient is not nervous/anxious.      Objective:   Physical Exam  Constitutional: She appears well-developed and well-nourished. No distress.  overwt and well app  HENT:  Head: Normocephalic and atraumatic.  Mouth/Throat: Oropharynx is clear and moist.  Eyes: Conjunctivae and EOM are normal. Pupils are equal, round, and reactive to light.  Neck: Normal range of motion. Neck supple.  Cardiovascular: Normal rate, regular rhythm,  normal heart sounds and intact distal pulses.   Pulmonary/Chest: Effort normal and breath sounds normal.  Musculoskeletal: She exhibits tenderness. She exhibits no edema.  R trapezius tenderness Nl rom RUE and shoulder  Neg hawking and neer tests   LS-no tenderness Mild R piriformis tenderness Nl rom LS and hip  Neg SLR Nl gait   Lymphadenopathy:    She has no cervical adenopathy.  Neurological: She is alert. She has normal reflexes. She displays no atrophy. No cranial nerve deficit or sensory deficit. She exhibits normal muscle tone. Coordination and gait normal.  Skin: Skin is warm and dry. No rash noted. No erythema. No pallor.  Psychiatric: She has a normal mood and affect.          Assessment & Plan:   Problem List Items Addressed This Visit     Musculoskeletal and Integument   Trapezius strain     On R  Hx of prev neck problems No neuro s/s Recommend heat/ cervical support pillow and flexeril prn (with risk of sedation) Update if not starting to improve in a week or if worsening        Other   Piriformis muscle pain - Primary     On R in pt with prev lumbar surg Fairly nl exam and rom  Taught stretch of piriformis to do as needed , also handout with more stretches Recommend heat 10 min at a time Flexeril prn (sedation precaution) Update if not starting to improve in a week or if worsening

## 2014-09-07 NOTE — Progress Notes (Signed)
Pre visit review using our clinic review tool, if applicable. No additional management support is needed unless otherwise documented below in the visit note. 

## 2014-09-08 LAB — HM PAP SMEAR

## 2014-09-12 ENCOUNTER — Other Ambulatory Visit: Payer: Self-pay | Admitting: Family Medicine

## 2014-10-26 ENCOUNTER — Encounter: Payer: Self-pay | Admitting: Family Medicine

## 2015-02-06 DIAGNOSIS — G4733 Obstructive sleep apnea (adult) (pediatric): Secondary | ICD-10-CM | POA: Diagnosis not present

## 2015-03-30 ENCOUNTER — Ambulatory Visit (INDEPENDENT_AMBULATORY_CARE_PROVIDER_SITE_OTHER)
Admission: RE | Admit: 2015-03-30 | Discharge: 2015-03-30 | Disposition: A | Discharge status: Home / Self Care | Payer: 59 | Source: Ambulatory Visit | Attending: Family Medicine | Admitting: Family Medicine

## 2015-03-30 ENCOUNTER — Encounter: Payer: Self-pay | Admitting: Family Medicine

## 2015-03-30 ENCOUNTER — Ambulatory Visit (INDEPENDENT_AMBULATORY_CARE_PROVIDER_SITE_OTHER): Payer: 59 | Admitting: Family Medicine

## 2015-03-30 VITALS — BP 102/62 | HR 83 | Temp 98.9°F | Ht 62.75 in | Wt 182.8 lb

## 2015-03-30 DIAGNOSIS — M79604 Pain in right leg: Secondary | ICD-10-CM

## 2015-03-30 DIAGNOSIS — M791 Myalgia: Secondary | ICD-10-CM | POA: Diagnosis not present

## 2015-03-30 DIAGNOSIS — M7918 Myalgia, other site: Secondary | ICD-10-CM

## 2015-03-30 NOTE — Progress Notes (Signed)
Subjective:    Patient ID: Sophia CarwinShernell Monique [name], female    DOB: [birth year], 39 y.o.   MRN: [id number]  HPI Here with pain in hip area  Got better after the fall (? 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 Piriformis muscle pain 09/07/2014  . Trapezius strain 09/07/2014  . Routine general medical examination at a health care facility 09/26/2013  . Neck muscle spasm 06/02/2013  . Essential hypertension 09/16/2012  . Headache 09/16/2012  . LOW BACK PAIN, ACUTE 05/21/2008  . 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 LEG PAIN, LEFT 12/30/2007  . OTHER NONTHROMBOCYTOPENIC PURPURAS 12/04/2007  . ALLERGIC RHINITIS 07/02/2007  . GERD 07/02/2007   Past Medical History  Diagnosis Date  . Allergic rhinitis     seasonal  . GERD (gastroesophageal reflux disease)   . Hypertension   . Abnormal Pap smear     cin-1  . 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 Colposcopy  1997    abnormal pap  . Partial hysterectomy  11/2005    bleeding  . Laminectomy and microdiscectomy lumbar spine  12/2007    L5-S1  . Wisdom tooth extraction    . Cervix lesion destruction     History  Substance Use Topics  . Smoking status: Never Smoker   . Smokeless tobacco: Not on file  . Alcohol Use: Yes     Comment: social   Family History  Problem Relation Age of Onset  . Hypertension Mother [id number]  . Diabetes Paternal Grandfather   . Breast cancer Maternal Grandmother   . Colon cancer Other     MGaunt  . Stroke Paternal  Grandmother   . Hypertension Father    Allergies  Allergen Reactions  . Lisinopril     Cough   . Morphine     REACTION: skin crawling  . Rofecoxib     REACTION: blurred vision, dizziness   Current Outpatient Prescriptions on File Prior to Visit  Medication Sig Dispense Refill  . cyclobenzaprine (FLEXERIL) 10 MG tablet Take 1 tablet (10 mg total) by mouth 3 (three) times daily as needed for muscle spasms (caution this may sedate). 30 tablet 1  . losartan-hydrochlorothiazide (HYZAAR) 50-12.5 MG per tablet Take 1 tablet by mouth  daily 90 tablet 1  . Multiple Vitamin (MULTIVITAMIN) capsule Take 1 capsule by mouth daily.    . valACYclovir (VALTREX) 500 MG tablet Take 1 tablet po QD or BID x 3 days prn 90 tablet 4  . fluticasone (FLONASE) 50 MCG/ACT nasal spray 2 sprays by Nasal route daily. 16 g 0   No current facility-administered medications on file prior to visit.     Review of Systems Review of Systems  Constitutional: Negative for fever, appetite change, fatigue and unexpected weight change.  Eyes: Negative for pain and visual disturbance.  Respiratory: Negative for cough and shortness of breath.   Cardiovascular: Negative for  cp or palpitations    Gastrointestinal: Negative for nausea, diarrhea and constipation.  Genitourinary: Negative for urgency and frequency.  Skin: Negative for pallor or rash   MSK neg for joint swelling or tenderness  Neurological: Negative for weakness, light-headedness, numbness and headaches.  Hematological: Negative for adenopathy. Does not bruise/bleed easily.  Psychiatric/Behavioral: Negative for dysphoric mood. The patient is not nervous/anxious.         Objective:   Physical Exam  Constitutional: She appears well-developed and well-nourished. No distress.  obese and well appearing   Eyes: Conjunctivae and EOM are normal. Pupils are equal, round, and reactive to light.  Neck: Normal range of motion. Neck supple.  Cardiovascular: Normal  rate and regular rhythm.   Pulmonary/Chest: Effort normal and breath sounds normal.  Abdominal: Soft. Bowel sounds are normal. She exhibits no distension. There is no tenderness.  No suprapubic tenderness or fullness    Musculoskeletal: She exhibits tenderness. She exhibits no edema.  Nl LS exam  Some mild tenderness in R piriformis area but no pain on piriformis stretch Nl rom bilat hips and knees  No trochanteric tenderness Gait favors L leg today  Neg SLR bilat   Lymphadenopathy:    She has no cervical adenopathy.  Neurological: She is alert. She has normal reflexes. No cranial nerve deficit. She exhibits normal muscle tone. Coordination normal.  Skin: Skin is warm and dry. No rash noted. No pallor.  Psychiatric: She has a normal mood and affect.          Assessment & Plan:   Problem List Items Addressed This Visit      Other   Right buttock pain - Primary   Relevant Orders   DG Lumbar Spine Complete (Completed)   DG HIP UNILAT WITH PELVIS 1V RIGHT (Completed)   Right leg pain    Ongoing R buttock and leg pain with fairly nl exam  xr LS and hip today  Analgesics prn  Adv to use heat (or ice it if feels better) Pending results       Relevant Orders   DG Lumbar Spine Complete (Completed)   DG HIP UNILAT WITH PELVIS 1V RIGHT (Completed)

## 2015-03-30 NOTE — Progress Notes (Signed)
Pre visit review using our clinic review tool, if applicable. No additional management support is needed unless otherwise documented below in the visit note. 

## 2015-03-30 NOTE — Patient Instructions (Signed)
xrays today  Ibuprofen 800 mg with food 3 times daily is ok  Use heat when you can - for 10-15 minutes  Keep stretching

## 2015-04-01 ENCOUNTER — Telehealth: Payer: Self-pay

## 2015-04-01 NOTE — Telephone Encounter (Signed)
I gave the normal results to patient. She is not sure what else she can do for the pain. She states that he is taking Tylenol every 4-6 hours for the pain, she is having a hard time putting weight on her right leg. She had the same problems 1 year ago and the pain eventually went away. Do you have any suggestions to help with the pain? She has not been to PT for the issue.

## 2015-04-01 NOTE — Telephone Encounter (Signed)
xrays look ok  I want her to have a visit with Dr Patsy Lageropland to try to help/evaluate further  Please make an appt  If she is having trouble with mychart please help her out- if she needs to un enroll from mychart please help her do that Thanks

## 2015-04-01 NOTE — Assessment & Plan Note (Signed)
Ongoing R buttock and leg pain with fairly nl exam  xr LS and hip today  Analgesics prn  Adv to use heat (or ice it if feels better) Pending results

## 2015-04-01 NOTE — Telephone Encounter (Signed)
Pt left v/m requesting cb about 03/30/15 xray results.could not get into result note.

## 2015-04-02 NOTE — Telephone Encounter (Signed)
Pt made appointment 4/18 with dr copland

## 2015-04-02 NOTE — Telephone Encounter (Signed)
Left message for patient to call office and schedule follow up with Dr Patsy Lageropland.

## 2015-04-05 ENCOUNTER — Other Ambulatory Visit: Payer: Self-pay | Admitting: Family Medicine

## 2015-04-12 ENCOUNTER — Institutional Professional Consult (permissible substitution): Payer: 59 | Admitting: Family Medicine

## 2015-04-12 ENCOUNTER — Encounter: Payer: Self-pay | Admitting: Family Medicine

## 2015-04-12 ENCOUNTER — Ambulatory Visit (INDEPENDENT_AMBULATORY_CARE_PROVIDER_SITE_OTHER): Payer: 59 | Admitting: Family Medicine

## 2015-04-12 VITALS — BP 110/68 | HR 77 | Temp 98.8°F | Ht 62.75 in | Wt 180.2 lb

## 2015-04-12 DIAGNOSIS — G5701 Lesion of sciatic nerve, right lower limb: Secondary | ICD-10-CM | POA: Diagnosis not present

## 2015-04-12 DIAGNOSIS — M6780 Other specified disorders of synovium and tendon, unspecified site: Secondary | ICD-10-CM

## 2015-04-12 NOTE — Progress Notes (Signed)
Pre visit review using our clinic review tool, if applicable. No additional management support is needed unless otherwise documented below in the visit note. 

## 2015-04-12 NOTE — Progress Notes (Signed)
Dr. Karleen Hampshire T. Burnice Vassel, MD, CAQ [facility] [address] [place], [address] Phone: [phone] Fax: [phone]  04/12/2015  Patient: [name], MRN: [id number], DOB: 09-21-1976, 39 y.o.  Primary Physician:  Roxy Manns, MD  Chief Complaint: Leg Pain and Hip Pain  Subjective:   [name] is a 39 y.o. very pleasant female patient who presents with the following:  Consult: Dr. Milinda Antis Reason: Hip and buttocks pain, radiculopathy on the R  This patient with Body mass index is 32.18 kg/(m^2). Presents with ongoing posterior buttocks pain on the R and what she describes as hip pain as well as pain going down the posterior lateral aspect of her R leg intermittently.  This is worse at times when she is sitting in her chair at work, and also and she is driving for long periods of time.  She has recently started working out again, and some movements make this worse including walking on the treadmill and walking in general, but she does not seem to be bothered at all by doing the elliptical machine.  Other weightlifting of the lower extremity such as squats and lunges seemed to not bother this as well.  She is currently working with a Systems analyst.  She denies groin pain.  I looked at her films independently myself, and she has effectively no osteoarthritis of the hip joint.  Her disc spaces are well preserved on her lumbar spine films as well.  Not as bad as when saw Dr. Milinda Antis.  Now into the bottom area, some on the lateral vs. Back of calf.  [employer] on [address].  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   Did have a [employer] membership - did some light weights.  Pain all in the lateral, posterior buttocks.   Past Medical History, Surgical History, Social History, Family History, Problem List, Medications, and Allergies have been reviewed and updated if relevant.  GEN: no acute illness or  fever CV: No chest pain or shortness of breath MSK: detailed above Neuro: neurological signs are described above ROS O/w per HPI  Objective:   BP 110/68 mmHg  Pulse 77  Temp(Src) 98.8 F (37.1 C) (Oral)  Ht 5' 2.75" (1.594 m)  Wt 180 lb 4 oz (81.761 kg)  BMI 32.18 kg/m2  LMP 05/09/2006   GEN: Well-developed,well-nourished,in no acute distress; alert,appropriate and cooperative throughout examination HEENT: Normocephalic and atraumatic without obvious abnormalities. Ears, externally no deformities PULM: Breathing comfortably in no respiratory distress EXT: No clubbing, cyanosis, or edema PSYCH: Normally interactive. Cooperative during the interview. Pleasant. Friendly and conversant. Not anxious or depressed appearing. Normal, full affect.  Range of motion at  the waist: Flexion: normal Extension: normal Lateral bending: normal Rotation: all normal  No echymosis or edema Rises to examination table with no difficulty Gait: non antalgic  Inspection/Deformity: N Paraspinus Tenderness: mild only  B Ankle Dorsiflexion (L5,4): 5/5 B Great Toe Dorsiflexion (L5,4): 5/5 Heel Walk (L5): WNL Toe Walk (S1): WNL Rise/Squat (L4): WNL  SENSORY B Medial Foot (L4): WNL B Dorsum (L5): WNL B Lateral (S1): WNL Light Touch: WNL Pinprick: WNL  REFLEXES Knee (L4): 2+ Ankle (S1): 2+  B SLR, seated: neg B SLR, supine: neg B FABER: UNABLE TO COMPLETE, POOR FLEXIBILITY B Reverse FABER: VERY POOR FLEXIBILITY B Greater Troch: NT B Log Roll: neg B Stork: NT B Sciatic Notch: NT   HIP EXAM: SIDE: R ROM: Abduction, Flexion, Internal and  External range of motion: full Pain with terminal IROM and EROM: none GTB: NT SLR: NEG, poor HS flexibility Knees: No effusion Piriformis: TTP at direct palpation Str: flexion: 5/5 abduction: 5/5 adduction: 5/5 Strength testing non-tender   Radiology: Dg Lumbar Spine Complete  03/30/2015   CLINICAL DATA:  Right buttock pain radiating to the  right leg for 3 weeks  EXAM: LUMBAR SPINE - COMPLETE 4+ VIEW  COMPARISON:  Lumbar spine intraoperative films of 01/27/2008  FINDINGS: The lumbar vertebrae remain in normal alignment. Intervertebral disc spaces are unremarkable. No compression deformity is seen. The SI joints appear corticated. No significant degenerative change is noted involving the facet joints.  IMPRESSION: Normal alignment of the lumbar vertebrae.  Normal disc spaces.   Electronically Signed   By: Dwyane Dee M.D.   On: 03/30/2015 15:41   Dg Hip Unilat With Pelvis 1v Right  03/30/2015   CLINICAL DATA:  Right buttock and right hip pain for 3 weeks  EXAM: RIGHT HIP (WITH PELVIS) 1 VIEW  COMPARISON:  None.  FINDINGS: Both hip joint spaces appear normal for age. No acute abnormality is seen. The pelvic rami are intact. The SI joints appear corticated.  IMPRESSION: Negative.   Electronically Signed   By: Dwyane Dee M.D.   On: 03/30/2015 15:42    Assessment and Plan:   Piriformis syndrome of right side  Poor flexibility of tendon  Classic history and exam. Very poor flexibility is contributing a lot - mostly symptomatic at piriformis and glute medius on the right - irritating sciatic nerve that pierces piriformis. Incorporate specific ROM stretches into her daily routine and workouts and this usually gets better with time.   Using [employer] An[employer], I reviewed the anatomy associated with the patient's problem. Reviewed pathophysiology.   The patient was given a handout describing the anatomy and rehabilitation of the following condition: Piriformis Syndrome Also given a handout with more extensive Piriformis stretching, hip flexor and abductor strengthening, ham stretching  Rec deep massage, explained self-massage with ball   I appreciate the opportunity to evaluate this very friendly patient. If you have any question regarding her care or prognosis, do not hesitate to ask.   Follow-up: prn  Patient  Instructions  PIRIFORMIS SYNDROME REHAB .  SINK STRETCH - YOU CAN DO THIS WHENEVER YOU WANT DURING THE DAY  Tennis ball underneath area in buttocks - on a hard surface underneath Can also massage this area with an Hydrologist or hand     Signed,  Emilie Carp T. Vivian Okelley, MD   Patient's Medications  New Prescriptions   No medications on file  Previous Medications   ACETAMINOPHEN (TYLENOL) 325 MG TABLET    Take 650 mg by mouth every 6 (six) hours as needed.   CYCLOBENZAPRINE (FLEXERIL) 10 MG TABLET    Take 1 tablet (10 mg total) by mouth 3 (three) times daily as needed for muscle spasms (caution this may sedate).   FLUTICASONE (FLONASE) 50 MCG/ACT NASAL SPRAY    2 sprays by Nasal route daily.   IBUPROFEN (ADVIL,MOTRIN) 200 MG TABLET    Take 200 mg by mouth every 6 (six) hours as needed.   LOSARTAN-HYDROCHLOROTHIAZIDE (HYZAAR) 50-12.5 MG PER TABLET    Take 1 tablet by mouth  daily   MULTIPLE VITAMIN (MULTIVITAMIN) CAPSULE    Take 1 capsule by mouth daily.   VALACYCLOVIR (VALTREX) 500 MG TABLET    Take 1 tablet po QD or BID x 3 days prn  Modified  Medications   No medications on file  Discontinued Medications   No medications on file

## 2015-04-12 NOTE — Patient Instructions (Signed)
PIRIFORMIS SYNDROME REHAB .  SINK STRETCH - YOU CAN DO THIS WHENEVER YOU WANT DURING THE DAY  Tennis ball underneath area in buttocks - on a hard surface underneath Can also massage this area with an Hydrologistelectronic massager or hand

## 2015-08-04 ENCOUNTER — Ambulatory Visit (INDEPENDENT_AMBULATORY_CARE_PROVIDER_SITE_OTHER): Payer: 59 | Admitting: Family Medicine

## 2015-08-04 ENCOUNTER — Encounter: Payer: Self-pay | Admitting: Family Medicine

## 2015-08-04 VITALS — BP 120/70 | HR 75 | Temp 98.3°F | Ht 62.5 in | Wt 164.8 lb

## 2015-08-04 DIAGNOSIS — I10 Essential (primary) hypertension: Secondary | ICD-10-CM

## 2015-08-04 DIAGNOSIS — Z0000 Encounter for general adult medical examination without abnormal findings: Secondary | ICD-10-CM

## 2015-08-04 MED ORDER — LOSARTAN POTASSIUM-HCTZ 50-12.5 MG PO TABS: ORAL_TABLET | ORAL | Status: DC

## 2015-08-04 MED ORDER — VALACYCLOVIR HCL 1 G PO TABS: 500.0000 mg | ORAL_TABLET | Freq: Two times a day (BID) | ORAL | Status: DC | PRN

## 2015-08-04 MED ORDER — FLUTICASONE PROPIONATE 50 MCG/ACT NA SUSP: 2.0000 | Freq: Every day | NASAL | Status: DC

## 2015-08-04 NOTE — Patient Instructions (Signed)
Labs today -will send to [facility] Keep up the great work with diet and exercise  Get you flu shot in the fall

## 2015-08-04 NOTE — Progress Notes (Signed)
Pre visit review using our clinic review tool, if applicable. No additional management support is needed unless otherwise documented below in the visit note. 

## 2015-08-04 NOTE — Assessment & Plan Note (Signed)
bp in fair control at this time  BP Readings from Last 1 Encounters:  08/04/15 120/70   No changes needed Disc lifstyle change with low sodium diet and exercise  Will continue losartan hct and great lifestyle habits Lab today

## 2015-08-04 NOTE — Progress Notes (Signed)
Subjective:    Patient ID: [name], female    DOB: 07-05-1976, 39 y.o.   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 Right buttock pain 03/30/2015  . Right leg pain 03/30/2015  . Piriformis muscle pain 09/07/2014  . Trapezius strain 09/07/2014  . Routine general medical examination at a health care facility 09/26/2013  . Neck muscle spasm 06/02/2013  . Essential hypertension 09/16/2012  . Headache(784.0) 09/16/2012  . LOW BACK PAIN, ACUTE 05/21/2008  . 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 LEG PAIN, LEFT 12/30/2007  . OTHER NONTHROMBOCYTOPENIC PURPURAS 12/04/2007  . ALLERGIC RHINITIS 07/02/2007  . GERD 07/02/2007   Past Medical History  Diagnosis Date  . Allergic rhinitis     seasonal  . GERD (gastroesophageal reflux disease)   . Hypertension   . Abnormal Pap smear     cin-1  . 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 Colposcopy  1997    abnormal pap  . Partial  hysterectomy  11/2005    bleeding  . Laminectomy and microdiscectomy lumbar spine  12/2007    L5-S1  . Wisdom tooth extraction    . Cervix lesion destruction     Social History  Substance Use Topics  . Smoking status: Never Smoker   . Smokeless tobacco: Never Used  . Alcohol Use: 0.0 oz/week    0 Standard drinks or equivalent per week     Comment: social   Family History  Problem Relation Age of Onset  . Hypertension Mother 81  . Diabetes Paternal Grandfather   . Breast cancer Maternal Grandmother   . Colon cancer Other     MGaunt  . Stroke Paternal Grandmother   . Hypertension Father    Allergies  Allergen Reactions  . Lisinopril     Cough   . Morphine     REACTION: skin crawling  . Rofecoxib     REACTION: blurred vision, dizziness   Current Outpatient Prescriptions on File Prior to Visit  Medication Sig Dispense Refill  . acetaminophen (TYLENOL) 325 MG tablet Take 650 mg by mouth every 6 (six) hours as needed.    . cyclobenzaprine (FLEXERIL) 10 MG tablet Take 1 tablet (  10 mg total) by mouth 3 (three) times daily as needed for muscle spasms (caution this may sedate). 30 tablet 1  . fluticasone (FLONASE) 50 MCG/ACT nasal spray 2 sprays by Nasal route daily. 16 g 0  . ibuprofen (ADVIL,MOTRIN) 200 MG tablet Take 200 mg by mouth every 6 (six) hours as needed.    Marland Kitchen losartan-hydrochlorothiazide (HYZAAR) 50-12.5 MG per tablet Take 1 tablet by mouth  daily 90 tablet 3  . Multiple Vitamin (MULTIVITAMIN) capsule Take 1 capsule by mouth daily.    . valACYclovir (VALTREX) 500 MG tablet Take 1 tablet po QD or BID x 3 days prn 90 tablet 4   No current facility-administered medications on file prior to visit.     Review of Systems Review of Systems  Constitutional: Negative for fever, appetite change, fatigue and unexpected weight change.  Eyes: Negative for pain and visual disturbance.  Respiratory: Negative for cough and shortness of breath.   Cardiovascular: Negative for cp  or palpitations    Gastrointestinal: Negative for nausea, diarrhea and constipation.  Genitourinary: Negative for urgency and frequency. pos for hx of HSV Skin: Negative for pallor or rash   Neurological: Negative for weakness, light-headedness, numbness and headaches.  Hematological: Negative for adenopathy. Does not bruise/bleed easily.  Psychiatric/Behavioral: Negative for dysphoric mood. The patient is not nervous/anxious.         Objective:   Physical Exam  Constitutional: She appears well-developed and well-nourished. No distress.  overwt and well appearing Wt loss noted   HENT:  Head: Normocephalic and atraumatic.  Right Ear: External ear normal.  Left Ear: External ear normal.  Nose: Nose normal.  Mouth/Throat: Oropharynx is clear and moist.  Scant cerumen bilaterally  Eyes: Conjunctivae and EOM are normal. Pupils are equal, round, and reactive to light. Right eye exhibits no discharge. Left eye exhibits no discharge. No scleral icterus.  Neck: Normal range of motion. Neck supple. No JVD present. Carotid bruit is not present. No thyromegaly present.  Cardiovascular: Normal rate, regular rhythm, normal heart sounds and intact distal pulses.  Exam reveals no gallop.   Pulmonary/Chest: Effort normal and breath sounds normal. No respiratory distress. She has no wheezes. She has no rales.  Abdominal: Soft. Bowel sounds are normal. She exhibits no distension and no mass. There is no tenderness.  Musculoskeletal: She exhibits no edema or tenderness.  Lymphadenopathy:    She has no cervical adenopathy.  Neurological: She is alert. She has normal reflexes. No cranial nerve deficit. She exhibits normal muscle tone. Coordination normal.  Skin: Skin is warm and dry. No rash noted. No erythema. No pallor.  Psychiatric: She has a normal mood and affect.          Assessment & Plan:   Problem List Items Addressed This Visit    Essential hypertension    bp in fair control at this  time  BP Readings from Last 1 Encounters:  08/04/15 120/70   No changes needed Disc lifstyle change with low sodium diet and exercise  Will continue losartan hct and great lifestyle habits Lab today        Relevant Medications   losartan-hydrochlorothiazide (HYZAAR) 50-12.5 MG per tablet   Routine general medical examination at a health care facility - Primary    Reviewed health habits including diet and exercise and skin cancer prevention Reviewed appropriate screening tests for age  Also reviewed health mt list, fam hx and immunization status , as well as social and family history   Labs  today-will send to [facility] See HPI Commended wt loss and great work with diet and exercise        Relevant Orders   CBC with Differential/Platelet   Comprehensive metabolic panel   TSH   Lipid panel

## 2015-08-04 NOTE — Assessment & Plan Note (Signed)
Reviewed health habits including diet and exercise and skin cancer prevention Reviewed appropriate screening tests for age  Also reviewed health mt list, fam hx and immunization status , as well as social and family history   Labs today-will send to [facility] See HPI Commended wt loss and great work with diet and exercise

## 2015-08-05 LAB — LIPID PANEL
CHOL/HDL RATIO: 2.3 ratio (ref 0.0–4.4)
Cholesterol, Total: 172 mg/dL (ref 100–199)
HDL: 74 mg/dL (ref >39)
LDL Calculated: 79 mg/dL (ref 0–99)
Triglycerides: 93 mg/dL (ref 0–149)
VLDL Cholesterol Cal: 19 mg/dL (ref 5–40)

## 2015-08-05 LAB — CBC WITH DIFFERENTIAL/PLATELET
BASOS ABS: 0 10*3/uL (ref 0.0–0.2)
Basos: 0 %
EOS (ABSOLUTE): 0.3 10*3/uL (ref 0.0–0.4)
Eos: 4 %
Hematocrit: 42.5 % (ref 34.0–46.6)
Hemoglobin: 14.1 g/dL (ref 11.1–15.9)
Immature Grans (Abs): 0 10*3/uL (ref 0.0–0.1)
Immature Granulocytes: 0 %
Lymphocytes Absolute: 2 10*3/uL (ref 0.7–3.1)
Lymphs: 29 %
MCH: 30 pg (ref 26.6–33.0)
MCHC: 33.2 g/dL (ref 31.5–35.7)
MCV: 90 fL (ref 79–97)
MONOCYTES: 8 %
Monocytes Absolute: 0.5 10*3/uL (ref 0.1–0.9)
NEUTROS ABS: 3.9 10*3/uL (ref 1.4–7.0)
Neutrophils: 59 %
Platelets: 220 10*3/uL (ref 150–379)
RBC: 4.7 x10E6/uL (ref 3.77–5.28)
RDW: 13.6 % (ref 12.3–15.4)
WBC: 6.8 10*3/uL (ref 3.4–10.8)

## 2015-08-05 LAB — COMPREHENSIVE METABOLIC PANEL
A/G RATIO: 2 (ref 1.1–2.5)
ALT: 8 IU/L (ref 0–32)
AST: 13 IU/L (ref 0–40)
Albumin: 4.9 g/dL (ref 3.5–5.5)
Alkaline Phosphatase: 51 IU/L (ref 39–117)
BUN / CREAT RATIO: 15 (ref 8–20)
BUN: 13 mg/dL (ref 6–20)
Bilirubin Total: 0.7 mg/dL (ref 0.0–1.2)
CO2: 24 mmol/L (ref 18–29)
Calcium: 9.8 mg/dL (ref 8.7–10.2)
Chloride: 97 mmol/L (ref 97–108)
Creatinine, Ser: 0.88 mg/dL (ref 0.57–1.00)
GFR calc Af Amer: 96 mL/min/{1.73_m2} (ref >59)
GFR calc non Af Amer: 83 mL/min/{1.73_m2} (ref >59)
Globulin, Total: 2.4 g/dL (ref 1.5–4.5)
Glucose: 93 mg/dL (ref 65–99)
Potassium: 4.2 mmol/L (ref 3.5–5.2)
SODIUM: 138 mmol/L (ref 134–144)
Total Protein: 7.3 g/dL (ref 6.0–8.5)

## 2015-08-05 LAB — TSH: TSH: 0.981 u[IU]/mL (ref 0.450–4.500)

## 2016-01-03 ENCOUNTER — Ambulatory Visit (INDEPENDENT_AMBULATORY_CARE_PROVIDER_SITE_OTHER): Payer: 59 | Admitting: Family Medicine

## 2016-01-03 ENCOUNTER — Ambulatory Visit: Payer: 59 | Admitting: Family Medicine

## 2016-01-03 ENCOUNTER — Encounter: Payer: Self-pay | Admitting: Family Medicine

## 2016-01-03 VITALS — BP 140/64 | HR 90 | Temp 99.1°F | Ht 62.5 in | Wt 179.0 lb

## 2016-01-03 DIAGNOSIS — Z23 Encounter for immunization: Secondary | ICD-10-CM

## 2016-01-03 DIAGNOSIS — G4730 Sleep apnea, unspecified: Secondary | ICD-10-CM

## 2016-01-03 NOTE — Progress Notes (Signed)
Pre visit review using our clinic review tool, if applicable. No additional management support is needed unless otherwise documented below in the visit note. 

## 2016-01-03 NOTE — Patient Instructions (Signed)
You have symptoms of sleep apnea  Keep trying to work on weight loss  Stop at check out for referral to pulmonary to help evaluate and treat you for this

## 2016-01-03 NOTE — Progress Notes (Signed)
Subjective:    Patient ID: [name], female    DOB: 01/07/1976, 40 y.o.   MRN: [id number]  HPI Pt is here with concerns about sleep apnea   She thinks she stops breathing at night  She snores more than she used to [facility] herself up gasping for air   Boyfriend notices apnea- long pauses with no breathing   Wakes up exhausted even after 8 or more hours of sleep  (no headaches however)  Thinks she has sleep apnea   Brother and father have it   Has never been tested before   Obese  Wt is up 15 lb with bmi of 32   Patient Active Problem List   Diagnosis Date Noted  . Routine general medical examination at a health care facility 09/26/2013  . Essential hypertension 09/16/2012  . 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 ALLERGIC RHINITIS 07/02/2007  . GERD 07/02/2007   Past Medical History  Diagnosis Date  . Allergic rhinitis     seasonal  . GERD (gastroesophageal reflux disease)   . Hypertension   . Abnormal Pap smear     cin-1  . 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 Colposcopy  1997    abnormal pap  . Partial hysterectomy  11/2005    bleeding  . Laminectomy and microdiscectomy lumbar spine  12/2007    L5-S1  . Wisdom tooth extraction    . Cervix lesion destruction     Social History  Substance Use Topics  . Smoking status: Never Smoker   . Smokeless tobacco: Never Used  . Alcohol Use: 0.0 oz/week    0 Standard drinks or equivalent per week     Comment: social   Family History  Problem Relation Age of Onset  . Hypertension Mother 56  . Diabetes Paternal Grandfather   . Breast cancer Maternal Grandmother   . Colon cancer Other     MGaunt  . Stroke Paternal Grandmother   . Hypertension Father    Allergies  Allergen Reactions  . Lisinopril     Cough   . Morphine     REACTION: skin crawling  . Rofecoxib     REACTION: blurred vision, dizziness   Current  Outpatient Prescriptions on File Prior to Visit  Medication Sig Dispense Refill  . acetaminophen (TYLENOL) 325 MG tablet Take 650 mg by mouth every 6 (six) hours as needed.    . cyclobenzaprine (FLEXERIL) 10 MG tablet Take 1 tablet (10 mg total) by mouth 3 (three) times daily as needed for muscle spasms (caution this may sedate). 30 tablet 1  . fluticasone (FLONASE) 50 MCG/ACT nasal spray Place 2 sprays into both nostrils daily. 48 g 3  . ibuprofen (ADVIL,MOTRIN) 200 MG tablet Take 200 mg by mouth every 6 (six) hours as needed.    Marland Kitchen losartan-hydrochlorothiazide (HYZAAR) 50-12.5 MG per tablet Take 1 tablet by mouth daily 90 tablet 3  . Multiple Vitamin (MULTIVITAMIN) capsule Take 1 capsule by mouth daily.    . valACYclovir (VALTREX) 1000 MG tablet Take 0.5 tablets (500 mg total) by mouth 2 (two) times daily as needed. 90 tablet 1  . valACYclovir (VALTREX) 500 MG tablet Take 1 tablet po QD or BID x 3 days prn 90 tablet 4   No current facility-administered medications on file prior to visit.      Review of Systems    Review of Systems  Constitutional: Negative for fever, appetite change,  and unexpected weight change.  Eyes: Negative for pain and visual disturbance.  ENT pos for chronic nasal congestion and rhinorrhea/neg for facial pain  Respiratory: Negative for cough and shortness of breath.   Cardiovascular: Negative for cp or palpitations    Gastrointestinal: Negative for nausea, diarrhea and constipation.  Genitourinary: Negative for urgency and frequency.  Skin: Negative for pallor or rash   Neurological: Negative for weakness, light-headedness, numbness and headaches.  Hematological: Negative for adenopathy. Does not bruise/bleed easily.  Psychiatric/Behavioral: Negative for dysphoric mood. The patient is not nervous/anxious.      Objective:   Physical Exam  Constitutional: She appears well-developed and well-nourished. No distress.  obese and well appearing   HENT:  Head:  Normocephalic and atraumatic.  Mouth/Throat: Oropharynx is clear and moist.  Nares are boggy No sinus tenderness   Eyes: Conjunctivae and EOM are normal. Pupils are equal, round, and reactive to light.  Neck: Normal range of motion. Neck supple. No JVD present. Carotid bruit is not present. No thyromegaly present.  Cardiovascular: Normal rate, regular rhythm, normal heart sounds and intact distal pulses.  Exam reveals no gallop.   Pulmonary/Chest: Effort normal and breath sounds normal. No respiratory distress. She has no wheezes. She has no rales.  No crackles  Abdominal: Soft. Bowel sounds are normal. She exhibits no distension, no abdominal bruit and no mass. There is no tenderness.  Musculoskeletal: She exhibits no edema.  Lymphadenopathy:    She has no cervical adenopathy.  Neurological: She is alert. She has normal reflexes. No cranial nerve deficit.  Skin: Skin is warm and dry. No rash noted.  Psychiatric: She has a normal mood and affect.          Assessment & Plan:   Problem List Items Addressed This Visit      Other   Sleep apnea - Primary    Pt has classic symptoms of snoring and day time somnolence  Obese with recent wt gain as well  Witnessed apnea by her partner also  Ref to pulmonary for eval and tx  She it interested in cpap if a candidate  Enc wt loss       Relevant Orders   Ambulatory referral to Pulmonology    Other Visit Diagnoses    Need for influenza vaccination        Relevant Orders    Flu Vaccine QUAD 36+ mos PF IM (Fluarix & Fluzone Quad PF) (Completed)

## 2016-01-03 NOTE — Assessment & Plan Note (Signed)
Pt has classic symptoms of snoring and day time somnolence  Obese with recent wt gain as well  Witnessed apnea by her partner also  Ref to pulmonary for eval and tx  She it interested in cpap if a candidate  Enc wt loss

## 2016-01-21 ENCOUNTER — Ambulatory Visit (INDEPENDENT_AMBULATORY_CARE_PROVIDER_SITE_OTHER): Payer: 59 | Admitting: Internal Medicine

## 2016-01-21 ENCOUNTER — Encounter: Payer: Self-pay | Admitting: Internal Medicine

## 2016-01-21 VITALS — BP 118/72 | HR 97 | Ht 62.5 in | Wt 175.4 lb

## 2016-01-21 DIAGNOSIS — G4719 Other hypersomnia: Secondary | ICD-10-CM

## 2016-01-21 DIAGNOSIS — I10 Essential (primary) hypertension: Secondary | ICD-10-CM | POA: Diagnosis not present

## 2016-01-21 NOTE — Progress Notes (Signed)
[facility] Medicine Consultation      Assessment and Plan:  Excessive daytime sleepiness. -Loud snoring and witnessed apneas, suspicious for objective sleep apnea.  Essential hypertension. -Sleep apnea can contribute to elevated blood pressure, therefore, it would be important to monitor this.  Obesity. -Weight loss would be beneficial in terms of sleep apnea, and addition treatment of her sleep apnea could give her greater energy and contribute to her weight loss.  -Allergic rhinitis. -Continue Flonase as needed during allergy season. CPAP may irritate her nasal passages, may need to start on Flonase. If develops nasal problems with CPAP.  Date: 01/21/2016  MRN# [id number] [name] 1976/02/25  Referring Physician: Judie Petit. Tower, MD  [name] is a 40 y.o. old female seen in consultation for chief complaint of:    Chief Complaint  Patient presents with  . SLEEP CONSULT    pt. ref. by dr. Idamae Schuller tower. pt. c/o loud snoring, resless sleep. gasping for air while sleep. daytime sleepness. dozed off while driving. EPWORTH: 20    HPI:  The patient is a 40 year old female with a history of essential hypertension, allergic rhinitis, GERD. She presents with complaints of restless sleep, and witnessed apneas, as well as snoring. Patient has been told that she snores loudly and has long pauses of her breathing, as well as witnessed gasping for air. She says that she has extreme exhaustion and that she can be short of breath at times. She notes that she goes to bed between 9:30 PM and 11 PM, takes her about 30 minutes to fall asleep. She wakes up 2-3 times a night, and she typically gets out of bed at 6:30 AM, 7 AM. Her evaluation is notable for an Epworth score of 20.  She does note seasonal sinus allergies. She takes flonase during the spring and fall is not on it now, and has no symptoms at this time.   Her brother and other family members have  sleep apnea.   PMHX:   Past Medical History  Diagnosis Date  . Allergic rhinitis     seasonal  . GERD (gastroesophageal reflux disease)   . Hypertension   . Abnormal Pap smear     cin-1  . Herpes simplex without mention of complication     hsv-2   Surgical Hx:  Past Surgical History  Procedure Laterality Date  . Colposcopy  1997    abnormal pap  . Partial hysterectomy  11/2005    bleeding  . Laminectomy and microdiscectomy lumbar spine  12/2007    L5-S1  . Wisdom tooth extraction    . Cervix lesion destruction     Family Hx:  Family History  Problem Relation Age of Onset  . Hypertension Mother 19  . Diabetes Paternal Grandfather   . Breast cancer Maternal Grandmother   . Colon cancer Other     MGaunt  . Stroke Paternal Grandmother   . Hypertension Father    Social Hx:   Social History  Substance Use Topics  . Smoking status: Never Smoker   . Smokeless tobacco: Never Used  . Alcohol Use: 0.0 oz/week    0 Standard drinks or equivalent per week     Comment: social   Medication:   Current Outpatient Rx  Name  Route  Sig  Dispense  Refill  . acetaminophen (TYLENOL) 325 MG tablet   Oral   Take 650 mg by mouth every 6 (six) hours as needed.         Marland Kitchen  cyclobenzaprine (FLEXERIL) 10 MG tablet   Oral   Take 1 tablet (10 mg total) by mouth 3 (three) times daily as needed for muscle spasms (caution this may sedate).   30 tablet   1   . fluticasone (FLONASE) 50 MCG/ACT nasal spray   Each Nare   Place 2 sprays into both nostrils daily.   48 g   3   . ibuprofen (ADVIL,MOTRIN) 200 MG tablet   Oral   Take 200 mg by mouth every 6 (six) hours as needed.         Marland Kitchen losartan-hydrochlorothiazide (HYZAAR) 50-12.5 MG per tablet      Take 1 tablet by mouth daily   90 tablet   3   . Multiple Vitamin (MULTIVITAMIN) capsule   Oral   Take 1 capsule by mouth daily.         . valACYclovir (VALTREX) 1000 MG tablet   Oral   Take 0.5 tablets (500 mg total) by mouth  2 (two) times daily as needed.   90 tablet   1   . valACYclovir (VALTREX) 500 MG tablet      Take 1 tablet po QD or BID x 3 days prn   90 tablet   4       Allergies:  Lisinopril; Morphine; and Rofecoxib  Review of Systems: Gen:  Denies  fever, sweats, chills HEENT: Denies blurred vision, double vision. bleeds, Cvc:  No dizziness, chest pain. Resp:   Denies cough or sputum porduction, shortness of breath Gi: Denies swallowing difficulty, stomach pain. Gu:  Denies bladder incontinence, burning urine Ext:   No Joint pain, stiffness. Skin: No skin rash,  hives Endoc:  No polyuria, polydipsia. Psych: No depression, insomnia. Other:  All other systems were reviewed with the patient and were negative other that what is mentioned in the HPI.   Physical Examination:   VS: BP 118/72 mmHg  Pulse 97  Ht 5' 2.5" (1.588 m)  Wt 175 lb 6.4 oz (79.561 kg)  BMI 31.55 kg/m2  SpO2 98%  LMP 05/09/2006  General Appearance: No distress  Neuro:without focal findings,  speech normal,  HEENT: PERRLA, EOM intact.  Mallampati 3 Pulmonary: normal breath sounds, No wheezing.  CardiovascularNormal S1,S2.  No m/r/g.   Abdomen: Benign, Soft, non-tender. Renal:  No costovertebral tenderness  GU:  No performed at this time. Endoc: No evident thyromegaly, no signs of acromegaly. Skin:   warm, no rashes, no ecchymosis  Extremities: normal, no cyanosis, clubbing.  Other findings:    LABORATORY PANEL:   CBC No results for input(s): WBC, HGB, HCT, PLT in the last 168 hours. ------------------------------------------------------------------------------------------------------------------  Chemistries  No results for input(s): NA, K, CL, CO2, GLUCOSE, BUN, CREATININE, CALCIUM, MG, AST, ALT, ALKPHOS, BILITOT in the last 168 hours.  Invalid input(s): GFRCGP ------------------------------------------------------------------------------------------------------------------  Cardiac Enzymes No  results for input(s): TROPONINI in the last 168 hours. ------------------------------------------------------------  RADIOLOGY:  No results found.     Thank  you for the consultation and for allowing [facility] to assist in the care of your patient. Our recommendations are noted above.  Please contact us if we can be of further service.   Wells Guiles, MD.  Board Certified in Internal Medicine, Pulmonary Medicine, Critical Care Medicine, and Sleep Medicine.   Pulmonary and Critical Care  Santiago Glad, M.D.  Stephanie Acre, M.D.  Billy Fischer, M.D

## 2016-01-21 NOTE — Patient Instructions (Signed)
Will send for sleep study.    Sleep Apnea Sleep apnea is disorder that affects a person's sleep. A person with sleep apnea has abnormal pauses in their breathing when they sleep. It is hard for them to get a good sleep. This makes a person tired during the day. It also can lead to other physical problems. There are three types of sleep apnea. One type is when breathing stops for a short time because your airway is blocked (obstructive sleep apnea). Another type is when the brain sometimes fails to give the normal signal to breathe to the muscles that control your breathing (central sleep apnea). The third type is a combination of the other two types. HOME CARE   Take all medicine as told by your doctor.  Avoid alcohol, calming medicines (sedatives), and depressant drugs.  Try to lose weight if you are overweight. Talk to your doctor about a healthy weight goal.  Your doctor may have you use a device that helps to open your airway. It can help you get the air that you need. It is called a positive airway pressure (PAP) device.   MAKE SURE YOU:   Understand these instructions.  Will watch your condition.  Will get help right away if you are not doing well or get worse.  It may take approximately 1 month for you to get used to wearing her CPAP every night.   

## 2016-01-24 ENCOUNTER — Telehealth: Payer: Self-pay | Admitting: *Deleted

## 2016-01-24 DIAGNOSIS — G4719 Other hypersomnia: Secondary | ICD-10-CM

## 2016-01-24 NOTE — Telephone Encounter (Signed)
HST due to pt's insurance.

## 2016-02-02 DIAGNOSIS — G4733 Obstructive sleep apnea (adult) (pediatric): Secondary | ICD-10-CM | POA: Diagnosis not present

## 2016-02-08 ENCOUNTER — Other Ambulatory Visit: Payer: Self-pay | Admitting: *Deleted

## 2016-02-08 DIAGNOSIS — G4719 Other hypersomnia: Secondary | ICD-10-CM

## 2016-02-18 ENCOUNTER — Telehealth: Payer: Self-pay | Admitting: *Deleted

## 2016-02-18 NOTE — Telephone Encounter (Signed)
LMOM for pt to return call to discuss next step after HST.

## 2016-02-18 NOTE — Telephone Encounter (Signed)
-----   Message from Shane Crutch, MD sent at 02/11/2016 12:01 PM EST ----- Regarding: HST Sleep study showed that she just missed the criteria for diagnosis of sleep apnea. Can we repeat the HST in 3 months? Otherwise lets see if we can get an in-lab sleep study approved for her.  ----- Message -----    From: Coralyn Helling, MD    Sent: 02/07/2016   2:04 PM      To: Shane Crutch, MD  Deep,  Home sleep study from 02/02/16 showed AHI of 4 with SpO2 low of 92%.  If this is strong suspicion for OSA, then could consider doing in lab study.  [place]

## 2016-02-21 NOTE — Telephone Encounter (Signed)
LMOM for pt to return call. 

## 2016-02-24 ENCOUNTER — Encounter: Payer: Self-pay | Admitting: *Deleted

## 2016-02-24 NOTE — Telephone Encounter (Signed)
LMOM that we have trying to contact pt in regards to her next steps for the Sleep test. Will mail letter.

## 2016-03-03 ENCOUNTER — Telehealth: Payer: Self-pay | Admitting: *Deleted

## 2016-03-03 ENCOUNTER — Encounter: Payer: Self-pay | Admitting: Internal Medicine

## 2016-03-03 NOTE — Telephone Encounter (Signed)
Please call pt at work number when you hear back from insurance about in lad study.

## 2016-03-03 NOTE — Telephone Encounter (Signed)
[facility] on 02/22/16 (Ref # [id number]) for pt to be allowed to have an in lab study based off the recommendation of the HST.  Called [employer] to check on status today, spoke with Selena BattenKim at [facility] who stated that this appeal shows that it is in review by the nurse reviewer at this time.  Need to allow 14-21 days for response. Rhonda J Cobb

## 2016-03-07 ENCOUNTER — Ambulatory Visit: Payer: 59 | Admitting: Internal Medicine

## 2016-03-07 ENCOUNTER — Other Ambulatory Visit: Payer: Self-pay | Admitting: *Deleted

## 2016-03-07 ENCOUNTER — Other Ambulatory Visit: Payer: Self-pay

## 2016-03-07 DIAGNOSIS — G4719 Other hypersomnia: Secondary | ICD-10-CM

## 2016-03-07 NOTE — Telephone Encounter (Signed)
Reached patient on work number and she is aware that in lab study has been [facility].  Pt is aware that [facility] will contact her to arrange this study.  Nothing else needed at this time. Pt advised that after we receive the results of this study, we will be back in touch with her to advise what would be the next step.  Pt advised results will take about 10-14 days before we will receive the results. Rhonda J Cobb

## 2016-03-07 NOTE — Telephone Encounter (Signed)
Okey Dupreose, RN (nurse reviewer with [facility]) called and [facility] for in lab study - procedure code [id number] NPSG due to the HST being inconclusive.  Authorization number # [id number] Valid from 02/22/16 to 05/22/16.  Order faxed to [facility] to contact patient to arrange in lab study.  LMOAM for pt to return my call to advise. Rhonda J Cobb

## 2016-03-23 ENCOUNTER — Encounter: Payer: Self-pay | Admitting: Internal Medicine

## 2016-03-28 ENCOUNTER — Ambulatory Visit: Payer: 59 | Attending: Pulmonary Disease

## 2016-03-28 DIAGNOSIS — I10 Essential (primary) hypertension: Secondary | ICD-10-CM | POA: Diagnosis not present

## 2016-03-28 DIAGNOSIS — G4710 Hypersomnia, unspecified: Secondary | ICD-10-CM | POA: Insufficient documentation

## 2016-03-28 DIAGNOSIS — G4719 Other hypersomnia: Secondary | ICD-10-CM | POA: Diagnosis present

## 2016-03-31 DIAGNOSIS — G4710 Hypersomnia, unspecified: Secondary | ICD-10-CM | POA: Diagnosis not present

## 2016-04-06 ENCOUNTER — Telehealth: Payer: Self-pay | Admitting: *Deleted

## 2016-04-06 NOTE — Telephone Encounter (Signed)
LMOM for pt to return call in regards to in lab sleep study results. Tried calling work number as pt requested before but pt unavailable.

## 2016-04-06 NOTE — Telephone Encounter (Signed)
Pt informed of sleep study results which are negative. She had me to cancel f/u appt on 4/21 stating she doesn't no longer need appt. Nothing further needed.

## 2016-04-14 ENCOUNTER — Ambulatory Visit: Payer: 59 | Admitting: Internal Medicine

## 2016-07-23 ENCOUNTER — Other Ambulatory Visit: Payer: Self-pay | Admitting: Family Medicine

## 2016-09-06 ENCOUNTER — Telehealth: Payer: Self-pay | Admitting: Family Medicine

## 2016-09-06 DIAGNOSIS — Z0000 Encounter for general adult medical examination without abnormal findings: Secondary | ICD-10-CM

## 2016-09-06 NOTE — Telephone Encounter (Signed)
Patient has a physical scheduled on 11/24/16.  Patient works for [employer] and would like an order mailed to her.  If there are any questions, patient can be reached at [phone].

## 2016-09-08 NOTE — Telephone Encounter (Signed)
I ordered for [facility] lab collect-please release and send her a copy of the order Thanks  Will cc to Terri

## 2016-09-11 NOTE — Telephone Encounter (Signed)
Orders were released and I mailed pt the lab orders

## 2016-11-21 LAB — COMPREHENSIVE METABOLIC PANEL
ALT: 13 IU/L (ref 0–32)
AST: 18 IU/L (ref 0–40)
Albumin/Globulin Ratio: 1.6 (ref 1.2–2.2)
Albumin: 4.4 g/dL (ref 3.5–5.5)
Alkaline Phosphatase: 46 IU/L (ref 39–117)
BUN/Creatinine Ratio: 15 (ref 9–23)
BUN: 12 mg/dL (ref 6–24)
Bilirubin Total: 1.2 mg/dL (ref 0.0–1.2)
CALCIUM: 9.2 mg/dL (ref 8.7–10.2)
CO2: 23 mmol/L (ref 18–29)
CREATININE: 0.8 mg/dL (ref 0.57–1.00)
Chloride: 100 mmol/L (ref 96–106)
GFR calc Af Amer: 107 mL/min/{1.73_m2} (ref >59)
GFR, EST NON AFRICAN AMERICAN: 93 mL/min/{1.73_m2} (ref >59)
Globulin, Total: 2.8 g/dL (ref 1.5–4.5)
Glucose: 101 mg/dL — ABNORMAL HIGH (ref 65–99)
Potassium: 4 mmol/L (ref 3.5–5.2)
Sodium: 141 mmol/L (ref 134–144)
TOTAL PROTEIN: 7.2 g/dL (ref 6.0–8.5)

## 2016-11-21 LAB — CBC WITH DIFFERENTIAL/PLATELET
Basophils Absolute: 0 10*3/uL (ref 0.0–0.2)
Basos: 0 %
EOS (ABSOLUTE): 0.1 10*3/uL (ref 0.0–0.4)
EOS: 2 %
HEMATOCRIT: 39.7 % (ref 34.0–46.6)
Hemoglobin: 13.4 g/dL (ref 11.1–15.9)
IMMATURE GRANS (ABS): 0 10*3/uL (ref 0.0–0.1)
IMMATURE GRANULOCYTES: 0 %
LYMPHS: 27 %
Lymphocytes Absolute: 1.8 10*3/uL (ref 0.7–3.1)
MCH: 31.2 pg (ref 26.6–33.0)
MCHC: 33.8 g/dL (ref 31.5–35.7)
MCV: 93 fL (ref 79–97)
MONOS ABS: 0.5 10*3/uL (ref 0.1–0.9)
Monocytes: 8 %
NEUTROS PCT: 63 %
Neutrophils Absolute: 4.2 10*3/uL (ref 1.4–7.0)
PLATELETS: 234 10*3/uL (ref 150–379)
RBC: 4.29 x10E6/uL (ref 3.77–5.28)
RDW: 13.4 % (ref 12.3–15.4)
WBC: 6.7 10*3/uL (ref 3.4–10.8)

## 2016-11-21 LAB — LIPID PANEL
CHOL/HDL RATIO: 2.3 ratio (ref 0.0–4.4)
Cholesterol, Total: 177 mg/dL (ref 100–199)
HDL: 77 mg/dL (ref >39)
LDL CALC: 79 mg/dL (ref 0–99)
Triglycerides: 106 mg/dL (ref 0–149)
VLDL Cholesterol Cal: 21 mg/dL (ref 5–40)

## 2016-11-21 LAB — TSH: TSH: 1.15 u[IU]/mL (ref 0.450–4.500)

## 2016-11-24 ENCOUNTER — Ambulatory Visit (INDEPENDENT_AMBULATORY_CARE_PROVIDER_SITE_OTHER): Payer: 59 | Admitting: Family Medicine

## 2016-11-24 ENCOUNTER — Encounter: Payer: Self-pay | Admitting: Family Medicine

## 2016-11-24 VITALS — BP 112/74 | HR 72 | Temp 98.7°F | Ht 63.0 in | Wt 174.0 lb

## 2016-11-24 DIAGNOSIS — Z0000 Encounter for general adult medical examination without abnormal findings: Secondary | ICD-10-CM | POA: Diagnosis not present

## 2016-11-24 DIAGNOSIS — J3089 Other allergic rhinitis: Secondary | ICD-10-CM | POA: Diagnosis not present

## 2016-11-24 DIAGNOSIS — Z113 Encounter for screening for infections with a predominantly sexual mode of transmission: Secondary | ICD-10-CM | POA: Insufficient documentation

## 2016-11-24 DIAGNOSIS — R0683 Snoring: Secondary | ICD-10-CM | POA: Insufficient documentation

## 2016-11-24 DIAGNOSIS — I10 Essential (primary) hypertension: Secondary | ICD-10-CM

## 2016-11-24 DIAGNOSIS — Z23 Encounter for immunization: Secondary | ICD-10-CM | POA: Diagnosis not present

## 2016-11-24 DIAGNOSIS — Z1231 Encounter for screening mammogram for malignant neoplasm of breast: Secondary | ICD-10-CM | POA: Insufficient documentation

## 2016-11-24 NOTE — Assessment & Plan Note (Signed)
Scheduled annual screening mammogram Nl breast exam today  Encouraged monthly self exams   

## 2016-11-24 NOTE — Patient Instructions (Addendum)
Use your flonase every day / year around to see if it helps with chronic sinus congestion and snoring  Also get a vaporizer for the bedroom   Don't forget to follow up with your gyn   See the handout on kegel exercises  Talk to your gyn about the incontinence /urinary issues   Stop at check out for referral for a mammogram   Blood glucose is in the high normal range - do avoid sweets for the most part - stop drinking sugar -no juice /soda/sweet tea  Just drink water-this will help weight loss   Great cholesterol ! Keep eating a healthy diet and exercising   Lab today for STD screen

## 2016-11-24 NOTE — Assessment & Plan Note (Signed)
Recommend flonase every day for chronic congestion and also snoring

## 2016-11-24 NOTE — Assessment & Plan Note (Signed)
bp in fair control at this time  BP Readings from Last 1 Encounters:  11/24/16 112/74   No changes needed Disc lifstyle change with low sodium diet and exercise   Labs reviewed

## 2016-11-24 NOTE — Progress Notes (Signed)
Subjective:    Patient ID: [name], female    DOB: 1976-11-01, 40 y.o.   MRN: [id number]  HPI  Here for health maintenance exam and to review chronic medical problems    Working a lot  Feels generally good  Still tired  Had a sleep study - did not qualify for cpap / she snores however  Always has sinus problems/ snoring wakes her up  She does not use her flonase every day     Wt Readings from Last 3 Encounters:  11/24/16 174 lb (78.9 kg)  01/21/16 175 lb 6.4 oz (79.6 kg)  01/03/16 179 lb (81.2 kg)  lost more before the holidays (was down to 162 and then gained back)  A lot of overtime/less time to exercise - tries to stay fit  Eats healthy for the most part (needs to eat more regularly  bmi is 30.8  Flu shot -   Pap 9/15 Last gyn visit was in 2016 - due to schedule an appt Had a partial hysterectomy  in 06 for bleeding   Tetanus 12/08  hiv screen 12/13  She is interested in STD testing today - needs to do at [facility]  Will do urine screen here and blood at [facility]   Mammogram 6/15-stable calcifications Self breast exam -no lumps or changes    bp is stable today  No cp or palpitations or headaches or edema  No side effects to medicines  BP Readings from Last 3 Encounters:  11/24/16 112/74  01/21/16 118/72  01/03/16 140/64     On losartan hct - working well   Results for orders placed or performed in visit on 09/06/16  CBC with Differential/Platelet  Result Value Ref Range   WBC 6.7 3.4 - 10.8 x10E3/uL   RBC 4.29 3.77 - 5.28 x10E6/uL   Hemoglobin 13.4 11.1 - 15.9 g/dL   Hematocrit 78.2 95.6 - 46.6 %   MCV 93 79 - 97 fL   MCH 31.2 26.6 - 33.0 pg   MCHC 33.8 31.5 - 35.7 g/dL   RDW 21.3 08.6 - 57.8 %   Platelets 234 150 - 379 x10E3/uL   Neutrophils 63 Not Estab. %   Lymphs 27 Not Estab. %   Monocytes 8 Not Estab. %   Eos 2 Not Estab. %   Basos 0 Not Estab. %   Neutrophils Absolute 4.2 1.4 - 7.0 x10E3/uL   Lymphocytes Absolute 1.8  0.7 - 3.1 x10E3/uL   Monocytes Absolute 0.5 0.1 - 0.9 x10E3/uL   EOS (ABSOLUTE) 0.1 0.0 - 0.4 x10E3/uL   Basophils Absolute 0.0 0.0 - 0.2 x10E3/uL   Immature Granulocytes 0 Not Estab. %   Immature Grans (Abs) 0.0 0.0 - 0.1 x10E3/uL  Comprehensive metabolic panel  Result Value Ref Range   Glucose 101 (H) 65 - 99 mg/dL   BUN 12 6 - 24 mg/dL   Creatinine, Ser 4.69 0.57 - 1.00 mg/dL   GFR calc non Af Amer 93 >59 mL/min/1.73   GFR calc Af Amer 107 >59 mL/min/1.73   BUN/Creatinine Ratio 15 9 - 23   Sodium 141 134 - 144 mmol/L   Potassium 4.0 3.5 - 5.2 mmol/L   Chloride 100 96 - 106 mmol/L   CO2 23 18 - 29 mmol/L   Calcium 9.2 8.7 - 10.2 mg/dL   Total Protein 7.2 6.0 - 8.5 g/dL   Albumin 4.4 3.5 - 5.5 g/dL   Globulin, Total 2.8 1.5 - 4.5 g/dL  Albumin/Globulin Ratio 1.6 1.2 - 2.2   Bilirubin Total 1.2 0.0 - 1.2 mg/dL   Alkaline Phosphatase 46 39 - 117 IU/L   AST 18 0 - 40 IU/L   ALT 13 0 - 32 IU/L  TSH  Result Value Ref Range   TSH 1.150 0.450 - 4.500 uIU/mL  Lipid panel  Result Value Ref Range   Cholesterol, Total 177 100 - 199 mg/dL   Triglycerides 161106 0 - 149 mg/dL   HDL 77 >09>39 mg/dL   VLDL Cholesterol Cal 21 5 - 40 mg/dL   LDL Calculated 79 0 - 99 mg/dL   Chol/HDL Ratio 2.3 0.0 - 4.4 ratio units    Does not eat red meat or pork  Excellent cholesterol profile  Review of Systems Review of Systems  Constitutional: Negative for fever, appetite change, and unexpected weight change. pos for ongoing fatigue  Eyes: Negative for pain and visual disturbance.  Respiratory: Negative for cough and shortness of breath.  pos for snoring  Cardiovascular: Negative for cp or palpitations    Gastrointestinal: Negative for nausea, diarrhea and constipation.  Genitourinary: Negative for urgency and frequency. pos for urinary incontinence (stress) neg for vaginal d/c or lesions Skin: Negative for pallor or rash   Neurological: Negative for weakness, light-headedness, numbness and  headaches.  Hematological: Negative for adenopathy. Does not bruise/bleed easily.  Psychiatric/Behavioral: Negative for dysphoric mood. The patient is not nervous/anxious.         Objective:   Physical Exam  Constitutional: She appears well-developed and well-nourished. No distress.  obese and well appearing   HENT:  Head: Normocephalic and atraumatic.  Right Ear: External ear normal.  Left Ear: External ear normal.  Nose: Nose normal.  Mouth/Throat: Oropharynx is clear and moist.  Eyes: Conjunctivae and EOM are normal. Pupils are equal, round, and reactive to light. Right eye exhibits no discharge. Left eye exhibits no discharge. No scleral icterus.  Neck: Normal range of motion. Neck supple. No JVD present. Carotid bruit is not present. No thyromegaly present.  Cardiovascular: Normal rate, regular rhythm, normal heart sounds and intact distal pulses.  Exam reveals no gallop.   Pulmonary/Chest: Effort normal and breath sounds normal. No respiratory distress. She has no wheezes. She has no rales.  Abdominal: Soft. Bowel sounds are normal. She exhibits no distension and no mass. There is no tenderness.  Genitourinary:  Genitourinary Comments: Breast exam: No mass, nodules, thickening, tenderness, bulging, retraction, inflamation, nipple discharge or skin changes noted.  No axillary or clavicular LA.        Pelvic exam done by gyn  Musculoskeletal: She exhibits no edema or tenderness.  Lymphadenopathy:    She has no cervical adenopathy.  Neurological: She is alert. She has normal reflexes. No cranial nerve deficit. She exhibits normal muscle tone. Coordination normal.  Skin: Skin is warm and dry. No rash noted. No erythema. No pallor.  Some lentigines and skin tags  Psychiatric: She has a normal mood and affect.          Assessment & Plan:   Problem List Items Addressed This Visit      Cardiovascular and Mediastinum   Essential hypertension    bp in fair control at this  time  BP Readings from Last 1 Encounters:  11/24/16 112/74   No changes needed Disc lifstyle change with low sodium diet and exercise   Labs reviewed         Respiratory   Allergic rhinitis    Recommend flonase  every day for chronic congestion and also snoring         Other   Snoring    Recommend flonase daily  ENT if not imp She has mild sleep apnea       Screening mammogram, encounter for    Scheduled annual screening mammogram Nl breast exam today  Encouraged monthly self exams          Relevant Orders   MM DIGITAL SCREENING BILATERAL   Screen for STD (sexually transmitted disease)    Gc/chlam urine test  Then lab for HIV and RPR       Relevant Orders   RPR (Completed)   HIV antibody (with reflex) (Completed)   GC/Chlamydia Probe Amp   Routine general medical examination at a health care facility - Primary    Reviewed health habits including diet and exercise and skin cancer prevention Reviewed appropriate screening tests for age  Also reviewed health mt list, fam hx and immunization status , as well as social and family history   Labs reviewed She will go to gyn for that exam and to discuss urinary issues  Flu shot today Enc further wt loss Std screening today  Disc blood sugar and what to watch       Other Visit Diagnoses    Need for influenza vaccination       Relevant Orders   Flu Vaccine QUAD 36+ mos IM (Completed)

## 2016-11-24 NOTE — Assessment & Plan Note (Signed)
Reviewed health habits including diet and exercise and skin cancer prevention Reviewed appropriate screening tests for age  Also reviewed health mt list, fam hx and immunization status , as well as social and family history   Labs reviewed She will go to gyn for that exam and to discuss urinary issues  Flu shot today Enc further wt loss Std screening today  Disc blood sugar and what to watch

## 2016-11-24 NOTE — Assessment & Plan Note (Signed)
Recommend flonase daily  ENT if not imp She has mild sleep apnea

## 2016-11-24 NOTE — Progress Notes (Signed)
Pre visit review using our clinic review tool, if applicable. No additional management support is needed unless otherwise documented below in the visit note. 

## 2016-11-24 NOTE — Assessment & Plan Note (Signed)
Gc/chlam urine test  Then lab for HIV and RPR

## 2016-11-25 LAB — HIV ANTIBODY (ROUTINE TESTING W REFLEX): HIV SCREEN 4TH GENERATION: NONREACTIVE

## 2016-11-25 LAB — RPR: RPR Ser Ql: NONREACTIVE

## 2016-11-28 LAB — GC/CHLAMYDIA PROBE AMP
CHLAMYDIA, DNA PROBE: NEGATIVE
Neisseria gonorrhoeae by PCR: NEGATIVE

## 2016-12-21 ENCOUNTER — Ambulatory Visit (INDEPENDENT_AMBULATORY_CARE_PROVIDER_SITE_OTHER): Payer: 59 | Admitting: Family Medicine

## 2016-12-21 ENCOUNTER — Encounter: Payer: Self-pay | Admitting: Family Medicine

## 2016-12-21 ENCOUNTER — Other Ambulatory Visit: Payer: Self-pay | Admitting: Family Medicine

## 2016-12-21 VITALS — BP 116/72 | HR 64 | Temp 98.4°F | Wt 180.0 lb

## 2016-12-21 DIAGNOSIS — J029 Acute pharyngitis, unspecified: Secondary | ICD-10-CM

## 2016-12-21 LAB — POCT RAPID STREP A (OFFICE): RAPID STREP A SCREEN: NEGATIVE

## 2016-12-21 NOTE — Patient Instructions (Signed)
Your rapid strep test is negative for strep throat, I will send off for culture. The result should be back in 2-3 days and I will send you a message regarding the results to your Mychart.  Most likely the pain is from either post nasal drainage- treat with ibuprofen, warm salt water gargles, throat lozenges or acid reflux which you can treat with over the counter ranitidine or famotidine.  If not better in 7-10 days or if you develop a fever or can't swallow, please let us know or go the emergency room is weekend or night

## 2016-12-21 NOTE — Progress Notes (Signed)
   Subjective:    Patient ID: [name], female    DOB: 10-31-1976, 40 y.o.   MRN: [id number]  HPI This is a 40 yo female who presents today with sore throat for several weeks. Started with cough and cough resolved. Thought it was allergies. Pain with swallowing. No fever or ear pain. Not sure if she has post nasal drainage. Rare cough. Takes flonase daily in the morning. Has history of acid reflux which is mostly controled with watching diet. Occasionally takes prilosec.   Past Medical History:  Diagnosis Date  . Abnormal Pap smear    cin-1  . Allergic rhinitis    seasonal  . GERD (gastroesophageal reflux disease)   . 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 Hypertension    Past Surgical History:  Procedure Laterality Date  . CERVIX LESION DESTRUCTION    . COLPOSCOPY  1997   abnormal pap  . LAMINECTOMY AND MICRODISCECTOMY LUMBAR SPINE  12/2007   L5-S1  . PARTIAL HYSTERECTOMY  11/2005   bleeding  . WISDOM TOOTH EXTRACTION     Family History  Problem Relation Age of Onset  . Hypertension Mother 2648  . Diabetes Paternal Grandfather   . Breast cancer Maternal Grandmother   . Colon cancer Other     MGaunt  . Stroke Paternal Grandmother   . Hypertension Father    Social History  Substance Use Topics  . Smoking status: Never Smoker  . Smokeless tobacco: Never Used  . Alcohol use 0.0 oz/week     Comment: social      Review of Systems Per HPI    Objective:   Physical Exam  Constitutional: She appears well-developed and well-nourished. No distress.  HENT:  Head: Normocephalic and atraumatic.  Right Ear: Tympanic membrane, external ear and ear canal normal.  Left Ear: Tympanic membrane and ear canal normal.  Nose: Nose normal.  Mouth/Throat: Uvula is midline. Posterior oropharyngeal erythema (mild) present. No oropharyngeal exudate or posterior oropharyngeal edema.  Skin: She is not diaphoretic.  Vitals reviewed.     BP 116/72    Pulse 64   Temp 98.4 F (36.9 C) (Oral)   Wt 180 lb (81.6 kg)   LMP 05/09/2006   SpO2 97%   BMI 31.89 kg/m  Wt Readings from Last 3 Encounters:  12/21/16 180 lb (81.6 kg)  11/24/16 174 lb (78.9 kg)  01/21/16 175 lb 6.4 oz (79.6 kg)    Assessment & Plan:  1. Sore throat - POCT rapid strep A- negative - Culture, Group A Strep -  Patient Instructions  Your rapid strep test is negative for strep throat, I will send off for culture. The result should be back in 2-3 days and I will send you a message regarding the results to your Mychart.  Most likely the pain is from either post nasal drainage- treat with ibuprofen, warm salt water gargles, throat lozenges or acid reflux which you can treat with over the counter ranitidine or famotidine.  If not better in 7-10 days or if you develop a fever or can't swallow, please let us know or go the emergency room is weekend or night    Olean Reeeborah Jermaine Tholl, FNP-BC  [facility]  12/21/2016 8:29 AM

## 2016-12-23 LAB — CULTURE, GROUP A STREP: Organism ID, Bacteria: NORMAL

## 2017-03-28 ENCOUNTER — Other Ambulatory Visit: Payer: Self-pay | Admitting: Family Medicine

## 2017-03-28 DIAGNOSIS — N6310 Unspecified lump in the right breast, unspecified quadrant: Secondary | ICD-10-CM

## 2017-04-03 ENCOUNTER — Ambulatory Visit
Admission: RE | Admit: 2017-04-03 | Discharge: 2017-04-03 | Disposition: A | Discharge status: Home / Self Care | Payer: 59 | Source: Ambulatory Visit | Attending: Family Medicine | Admitting: Family Medicine

## 2017-04-03 ENCOUNTER — Other Ambulatory Visit: Payer: Self-pay | Admitting: Family Medicine

## 2017-04-03 DIAGNOSIS — N6310 Unspecified lump in the right breast, unspecified quadrant: Secondary | ICD-10-CM

## 2017-04-03 DIAGNOSIS — R921 Mammographic calcification found on diagnostic imaging of breast: Secondary | ICD-10-CM

## 2017-10-12 ENCOUNTER — Other Ambulatory Visit: Payer: Self-pay | Admitting: Family Medicine

## 2017-11-19 ENCOUNTER — Telehealth: Payer: Self-pay

## 2017-11-19 DIAGNOSIS — I10 Essential (primary) hypertension: Secondary | ICD-10-CM

## 2017-11-19 DIAGNOSIS — Z0000 Encounter for general adult medical examination without abnormal findings: Secondary | ICD-10-CM

## 2017-11-19 NOTE — Telephone Encounter (Signed)
I did the order (future for lab collect for [employer] [facility]) Please fax requisition as well  thanks

## 2017-11-19 NOTE — Telephone Encounter (Signed)
Copied from CRM [id number]. Topic: Appointment Scheduling - Scheduling Inquiry for Clinic >> Nov 19, 2017  3:36 PM Landry MellowFoltz, Melissa J wrote: Reason for CRM: pt is having labs on 11/27/17. Pt works at [employer] and can get labs for free. Please fax order to [phone].  Questions for pt, please call [phone]   >> Nov 19, 2017  4:01 PM Patience MuscaIsley, Tyliyah Mcmeekin M, LPN wrote: Pt would need order for lab testing. Pt has CPX scheduled for 11/27/17.

## 2017-11-21 NOTE — Telephone Encounter (Signed)
[name] is taking care of this now.

## 2017-11-21 NOTE — Addendum Note (Signed)
Addended by: Alvina ChouWALSH, TERRI J on: 11/21/2017 09:38 AM   Modules accepted: Orders

## 2017-11-23 LAB — CBC WITH DIFFERENTIAL/PLATELET
Basophils Absolute: 0 10*3/uL (ref 0.0–0.2)
Basos: 0 %
EOS (ABSOLUTE): 0.1 10*3/uL (ref 0.0–0.4)
EOS: 1 %
HEMATOCRIT: 39.9 % (ref 34.0–46.6)
Hemoglobin: 13.9 g/dL (ref 11.1–15.9)
Immature Grans (Abs): 0 10*3/uL (ref 0.0–0.1)
Immature Granulocytes: 0 %
LYMPHS ABS: 1.9 10*3/uL (ref 0.7–3.1)
Lymphs: 25 %
MCH: 31.3 pg (ref 26.6–33.0)
MCHC: 34.8 g/dL (ref 31.5–35.7)
MCV: 90 fL (ref 79–97)
MONOS ABS: 0.8 10*3/uL (ref 0.1–0.9)
Monocytes: 10 %
Neutrophils Absolute: 5 10*3/uL (ref 1.4–7.0)
Neutrophils: 64 %
PLATELETS: 254 10*3/uL (ref 150–379)
RBC: 4.44 x10E6/uL (ref 3.77–5.28)
RDW: 13.2 % (ref 12.3–15.4)
WBC: 7.8 10*3/uL (ref 3.4–10.8)

## 2017-11-23 LAB — TSH: TSH: 2.55 u[IU]/mL (ref 0.450–4.500)

## 2017-11-23 LAB — COMPREHENSIVE METABOLIC PANEL
A/G RATIO: 1.8 (ref 1.2–2.2)
ALK PHOS: 54 IU/L (ref 39–117)
ALT: 11 IU/L (ref 0–32)
AST: 16 IU/L (ref 0–40)
Albumin: 4.8 g/dL (ref 3.5–5.5)
BUN/Creatinine Ratio: 13 (ref 9–23)
BUN: 13 mg/dL (ref 6–24)
Bilirubin Total: 1 mg/dL (ref 0.0–1.2)
CO2: 20 mmol/L (ref 20–29)
Calcium: 9.6 mg/dL (ref 8.7–10.2)
Chloride: 101 mmol/L (ref 96–106)
Creatinine, Ser: 0.99 mg/dL (ref 0.57–1.00)
GFR calc Af Amer: 82 mL/min/{1.73_m2} (ref >59)
GFR calc non Af Amer: 71 mL/min/{1.73_m2} (ref >59)
GLOBULIN, TOTAL: 2.7 g/dL (ref 1.5–4.5)
Glucose: 110 mg/dL — ABNORMAL HIGH (ref 65–99)
POTASSIUM: 3.7 mmol/L (ref 3.5–5.2)
SODIUM: 141 mmol/L (ref 134–144)
Total Protein: 7.5 g/dL (ref 6.0–8.5)

## 2017-11-23 LAB — LIPID PANEL
CHOL/HDL RATIO: 3 ratio (ref 0.0–4.4)
CHOLESTEROL TOTAL: 173 mg/dL (ref 100–199)
HDL: 58 mg/dL (ref >39)
LDL CALC: 97 mg/dL (ref 0–99)
Triglycerides: 88 mg/dL (ref 0–149)
VLDL CHOLESTEROL CAL: 18 mg/dL (ref 5–40)

## 2017-11-27 ENCOUNTER — Encounter: Payer: Self-pay | Admitting: Family Medicine

## 2017-11-27 ENCOUNTER — Ambulatory Visit (INDEPENDENT_AMBULATORY_CARE_PROVIDER_SITE_OTHER): Payer: 59 | Admitting: Family Medicine

## 2017-11-27 VITALS — BP 132/80 | HR 79 | Temp 98.4°F | Ht 63.0 in | Wt 180.0 lb

## 2017-11-27 DIAGNOSIS — I10 Essential (primary) hypertension: Secondary | ICD-10-CM | POA: Diagnosis not present

## 2017-11-27 DIAGNOSIS — R7309 Other abnormal glucose: Secondary | ICD-10-CM

## 2017-11-27 DIAGNOSIS — Z0000 Encounter for general adult medical examination without abnormal findings: Secondary | ICD-10-CM | POA: Diagnosis not present

## 2017-11-27 DIAGNOSIS — R7303 Prediabetes: Secondary | ICD-10-CM | POA: Insufficient documentation

## 2017-11-27 DIAGNOSIS — Z23 Encounter for immunization: Secondary | ICD-10-CM | POA: Diagnosis not present

## 2017-11-27 MED ORDER — FLUTICASONE PROPIONATE 50 MCG/ACT NA SUSP: 2.0000 | Freq: Every day | NASAL | 3 refills | Status: DC

## 2017-11-27 MED ORDER — VALACYCLOVIR HCL 1 G PO TABS: 500.0000 mg | ORAL_TABLET | Freq: Two times a day (BID) | ORAL | 1 refills | Status: DC | PRN

## 2017-11-27 MED ORDER — LOSARTAN POTASSIUM-HCTZ 50-12.5 MG PO TABS: 1.0000 | ORAL_TABLET | Freq: Every day | ORAL | 3 refills | Status: DC

## 2017-11-27 NOTE — Progress Notes (Signed)
Subjective:    Patient ID: [name], female    DOB: 1976-04-26, 41 y.o.   MRN: [id number]  HPI Here for health maintenance exam and to review chronic medical problems    Working a lot  Feeling pretty good overall   Wt Readings from Last 3 Encounters:  11/27/17 180 lb (81.6 kg)  12/21/16 180 lb (81.6 kg)  11/24/16 174 lb (78.9 kg)  weight loss program is working -phentermine /B12 and also vit B10  Has lost over 7 lb  Hard to make time for exercise  Eating pretty healthy (had been off track) - portion control and eating more frequently  Protein shake with flax seed for breakfast and fruit  31.89 kg/m  Flu shot -tends to get sick with  / will think about it   Pap 1 1/2 y ago?  Usually goes to gyn once per year - was getting pap from vaginal cuff  Has had a partial hysterectomy for fibroids/menorrhagia   Tetanus shot 12/08- due for 10 year   Mammogram stable 4/18 (fibrocystic) - re check 1 y  Self breast exam -no lumps now  Has backed off of caffeine and less breast lumps  MGM had breast cancer   bp is stable today  No cp or palpitations or headaches or edema  No side effects to medicines  BP Readings from Last 3 Encounters:  11/27/17 132/80  12/21/16 116/72  11/24/16 112/74  takes losartan hct     Pulse Readings from Last 3 Encounters:  11/27/17 79  12/21/16 64  11/24/16 72     Cholesterol  Lab Results  Component Value Date   CHOL 173 11/22/2017   CHOL 177 11/20/2016   CHOL 172 08/04/2015   Lab Results  Component Value Date   HDL 58 11/22/2017   HDL 77 11/20/2016   HDL 74 08/04/2015   Lab Results  Component Value Date   LDLCALC 97 11/22/2017   LDLCALC 79 11/20/2016   LDLCALC 79 08/04/2015   Lab Results  Component Value Date   TRIG 88 11/22/2017   TRIG 106 11/20/2016   TRIG 93 08/04/2015   Lab Results  Component Value Date   CHOLHDL 3.0 11/22/2017   CHOLHDL 2.3 11/20/2016   CHOLHDL 2.3 08/04/2015   No results found for:  LDLDIRECT HDL is good/ down a bit from last time  No beef or pork  No fried foods    Labs: Results for orders placed or performed in visit on 11/19/17  Lipid panel  Result Value Ref Range   Cholesterol, Total 173 100 - 199 mg/dL   Triglycerides 88 0 - 149 mg/dL   HDL 58 >40 mg/dL   VLDL Cholesterol Cal 18 5 - 40 mg/dL   LDL Calculated 97 0 - 99 mg/dL   Chol/HDL Ratio 3.0 0.0 - 4.4 ratio  CBC with Differential/Platelet  Result Value Ref Range   WBC 7.8 3.4 - 10.8 x10E3/uL   RBC 4.44 3.77 - 5.28 x10E6/uL   Hemoglobin 13.9 11.1 - 15.9 g/dL   Hematocrit 98.1 19.1 - 46.6 %   MCV 90 79 - 97 fL   MCH 31.3 26.6 - 33.0 pg   MCHC 34.8 31.5 - 35.7 g/dL   RDW 47.8 29.5 - 62.1 %   Platelets 254 150 - 379 x10E3/uL   Neutrophils 64 Not Estab. %   Lymphs 25 Not Estab. %   Monocytes 10 Not Estab. %   Eos 1 Not Estab. %  Basos 0 Not Estab. %   Neutrophils Absolute 5.0 1.4 - 7.0 x10E3/uL   Lymphocytes Absolute 1.9 0.7 - 3.1 x10E3/uL   Monocytes Absolute 0.8 0.1 - 0.9 x10E3/uL   EOS (ABSOLUTE) 0.1 0.0 - 0.4 x10E3/uL   Basophils Absolute 0.0 0.0 - 0.2 x10E3/uL   Immature Granulocytes 0 Not Estab. %   Immature Grans (Abs) 0.0 0.0 - 0.1 x10E3/uL  Comprehensive metabolic panel  Result Value Ref Range   Glucose 110 (H) 65 - 99 mg/dL   BUN 13 6 - 24 mg/dL   Creatinine, Ser 1.610.99 0.57 - 1.00 mg/dL   GFR calc non Af Amer 71 >59 mL/min/1.73   GFR calc Af Amer 82 >59 mL/min/1.73   BUN/Creatinine Ratio 13 9 - 23   Sodium 141 134 - 144 mmol/L   Potassium 3.7 3.5 - 5.2 mmol/L   Chloride 101 96 - 106 mmol/L   CO2 20 20 - 29 mmol/L   Calcium 9.6 8.7 - 10.2 mg/dL   Total Protein 7.5 6.0 - 8.5 g/dL   Albumin 4.8 3.5 - 5.5 g/dL   Globulin, Total 2.7 1.5 - 4.5 g/dL   Albumin/Globulin Ratio 1.8 1.2 - 2.2   Bilirubin Total 1.0 0.0 - 1.2 mg/dL   Alkaline Phosphatase 54 39 - 117 IU/L   AST 16 0 - 40 IU/L   ALT 11 0 - 32 IU/L  TSH  Result Value Ref Range   TSH 2.550 0.450 - 4.500 uIU/mL      Fasting glucose is 110   Face is very dry  Hx of eczema   Patient Active Problem List   Diagnosis Date Noted  . Elevated glucose level 11/27/2017  . Snoring 11/24/2016  . Screen for STD (sexually transmitted disease) 11/24/2016  . Screening mammogram, encounter for 11/24/2016  . Sleep apnea 01/03/2016  . Routine general medical examination at a health care facility 09/26/2013  . Essential hypertension 09/16/2012  . 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 Allergic rhinitis 07/02/2007  . GERD 07/02/2007   Past Medical History:  Diagnosis Date  . Abnormal Pap smear    cin-1  . Allergic rhinitis    seasonal  . GERD (gastroesophageal reflux disease)   . 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 Hypertension    Past Surgical History:  Procedure Laterality Date  . CERVIX LESION DESTRUCTION    . COLPOSCOPY  1997   abnormal pap  . LAMINECTOMY AND MICRODISCECTOMY LUMBAR SPINE  12/2007   L5-S1  . PARTIAL HYSTERECTOMY  11/2005   bleeding  . WISDOM TOOTH EXTRACTION     Social History   Tobacco Use  . Smoking status: Never Smoker  . Smokeless tobacco: Never Used  Substance Use Topics  . Alcohol use: Yes    Alcohol/week: 0.0 oz    Comment: social  . Drug use: No   Family History  Problem Relation Age of Onset  . Hypertension Mother 7248  . Hypertension Father   . Diabetes Paternal Grandfather   . Breast cancer Maternal Grandmother   . Colon cancer Other        MGaunt  . Stroke Paternal Grandmother    Allergies  Allergen Reactions  . Lisinopril     Cough   . Morphine     REACTION: skin crawling  . Oxycodone Itching  . Rofecoxib     REACTION: blurred vision, dizziness   Current Outpatient Medications on File Prior to Visit  Medication Sig Dispense Refill  .  acetaminophen (TYLENOL) 325 MG tablet Take 650 mg by mouth every 6 (six) hours as needed.    . Cyanocobalamin 1000 MCG/ML LIQD Inject 1 mL as directed every 21 ( twenty-one) days.    .  cyclobenzaprine (FLEXERIL) 10 MG tablet Take 1 tablet (10 mg total) by mouth 3 (three) times daily as needed for muscle spasms (caution this may sedate). 30 tablet 1  . ibuprofen (ADVIL,MOTRIN) 200 MG tablet Take 200 mg by mouth every 6 (six) hours as needed.    . Multiple Vitamin (MULTIVITAMIN) capsule Take 1 capsule by mouth daily.    . NON FORMULARY every 21 ( twenty-one) days. Lipotropic injection    . phentermine 37.5 MG capsule Take 37.5 mg by mouth 1 day or 1 dose.     No current facility-administered medications on file prior to visit.     Review of Systems  Constitutional: Negative for activity change, appetite change, fatigue, fever and unexpected weight change.  HENT: Negative for congestion, ear pain, rhinorrhea, sinus pressure and sore throat.   Eyes: Negative for pain, redness and visual disturbance.  Respiratory: Negative for cough, shortness of breath and wheezing.   Cardiovascular: Negative for chest pain and palpitations.  Gastrointestinal: Negative for abdominal pain, blood in stool, constipation and diarrhea.  Endocrine: Negative for polydipsia and polyuria.  Genitourinary: Negative for dysuria, frequency and urgency.  Musculoskeletal: Negative for arthralgias, back pain and myalgias.  Skin: Negative for pallor and rash.       Pos for dry skin on face with hx of eczema  Allergic/Immunologic: Negative for environmental allergies.  Neurological: Negative for dizziness, syncope and headaches.  Hematological: Negative for adenopathy. Does not bruise/bleed easily.  Psychiatric/Behavioral: Negative for decreased concentration and dysphoric mood. The patient is not nervous/anxious.        Objective:   Physical Exam  Constitutional: She appears well-developed and well-nourished. No distress.  obese and well appearing   HENT:  Head: Normocephalic and atraumatic.  Right Ear: External ear normal.  Left Ear: External ear normal.  Mouth/Throat: Oropharynx is clear and moist.   Eyes: Conjunctivae and EOM are normal. Pupils are equal, round, and reactive to light. No scleral icterus.  Neck: Normal range of motion. Neck supple. No JVD present. Carotid bruit is not present. No thyromegaly present.  Cardiovascular: Normal rate, regular rhythm, normal heart sounds and intact distal pulses. Exam reveals no gallop.  Pulmonary/Chest: Effort normal and breath sounds normal. No respiratory distress. She has no wheezes. She exhibits no tenderness.  Abdominal: Soft. Bowel sounds are normal. She exhibits no distension, no abdominal bruit and no mass. There is no tenderness.  Genitourinary: No breast swelling, tenderness, discharge or bleeding.  Genitourinary Comments: Breast exam: No mass, nodules, thickening, tenderness, bulging, retraction, inflamation, nipple discharge or skin changes noted.  No axillary or clavicular LA.      Musculoskeletal: Normal range of motion. She exhibits no edema or tenderness.  Lymphadenopathy:    She has no cervical adenopathy.  Neurological: She is alert. She has normal reflexes. No cranial nerve deficit. She exhibits normal muscle tone. Coordination normal.  Skin: Skin is warm and dry. No rash noted. No erythema. No pallor.  Dry skin on cheeks Few lentigines  Psychiatric: She has a normal mood and affect.          Assessment & Plan:   Problem List Items Addressed This Visit      Cardiovascular and Mediastinum   Essential hypertension - Primary    bp in  fair control at this time  BP Readings from Last 1 Encounters:  11/27/17 132/80   No changes needed Disc lifstyle change with low sodium diet and exercise  Labs reviewed  Watch closely in setting of phentermine       Relevant Medications   losartan-hydrochlorothiazide (HYZAAR) 50-12.5 MG tablet     Other   Elevated glucose level    disc imp of low glycemic diet and wt loss to prevent DM2  Enc further wt loss  Continue to follow  A1C with next labs       Routine general  medical examination at a health care facility    Reviewed health habits including diet and exercise and skin cancer prevention Reviewed appropriate screening tests for age  Also reviewed health mt list, fam hx and immunization status , as well as social and family history   See HPI Labs rev  Declines flu shot  Tdap today  Enc exercise and further wt loss        Other Visit Diagnoses    Need for Tdap vaccination       Relevant Orders   Tdap vaccine greater than or equal to 7yo IM (Completed)

## 2017-11-27 NOTE — Assessment & Plan Note (Signed)
Reviewed health habits including diet and exercise and skin cancer prevention Reviewed appropriate screening tests for age  Also reviewed health mt list, fam hx and immunization status , as well as social and family history   See HPI Labs rev  Declines flu shot  Tdap today  Enc exercise and further wt loss

## 2017-11-27 NOTE — Assessment & Plan Note (Signed)
disc imp of low glycemic diet and wt loss to prevent DM2  Enc further wt loss  Continue to follow  A1C with next labs

## 2017-11-27 NOTE — Patient Instructions (Addendum)
Try to get most of your carbohydrates from produce (with the exception of white potatoes)  Eat less bread/pasta/rice/snack foods/cereals/sweets and other items from the middle of the grocery store (processed carbs)   Fit in exercise when you can   Tdap vaccine today   Keep up the good work with better diet and weight loss    Change your face wash to Cetaphil -this should help dryness  Their moisturizer is also good

## 2017-11-27 NOTE — Assessment & Plan Note (Signed)
bp in fair control at this time  BP Readings from Last 1 Encounters:  11/27/17 132/80   No changes needed Disc lifstyle change with low sodium diet and exercise  Labs reviewed  Watch closely in setting of phentermine

## 2018-07-12 ENCOUNTER — Other Ambulatory Visit: Payer: Self-pay | Admitting: Family Medicine

## 2018-08-22 ENCOUNTER — Telehealth: Payer: Self-pay | Admitting: Family Medicine

## 2018-08-22 MED ORDER — LOSARTAN POTASSIUM 50 MG PO TABS: 50.0000 mg | ORAL_TABLET | Freq: Every day | ORAL | 1 refills | Status: DC

## 2018-08-22 MED ORDER — HYDROCHLOROTHIAZIDE 12.5 MG PO TABS: 12.5000 mg | ORAL_TABLET | Freq: Every day | ORAL | 1 refills | Status: DC

## 2018-08-22 NOTE — Telephone Encounter (Signed)
Not a patient at this location 

## 2018-08-22 NOTE — Telephone Encounter (Signed)
Copied from CRM #[id number]. Topic: Quick [id number] [id number] - Rx Refill/Question >> Aug 22, 2018  9:07 AM Alexander BergeronBarksdale, Sophia B wrote: Pt called b/c the medication below is out of stock w/ the pharmacy; pt is needing an alternate medication for the one below, contact pt or pharmacy to resolve  Medication: losartan-hydrochlorothiazide (HYZAAR) 50-12.5 MG tablet [[id number]][[id number]]   Has the patient contacted their pharmacy? Yes.   (Agent: If no, request that the patient contact the pharmacy for the refill.) (Agent: If yes, when and what did the pharmacy advise?)  Preferred Pharmacy (with phone number or street name): [facility]  Agent: Please be advised that RX refills may take up to 3 business days. We ask that you follow-up with your pharmacy.

## 2018-08-22 NOTE — Telephone Encounter (Signed)
Spoke with pt and we sent the 2 separate meds into her mail order pharmacy pt will take both meds at the same time

## 2018-08-22 NOTE — Telephone Encounter (Signed)
Losartan 50-12.5  I assume they have the individual meds?  If so send 90 day with one refill of losartan 50 and hctz 12.5 Thanks

## 2018-11-12 ENCOUNTER — Telehealth: Payer: Self-pay | Admitting: Family Medicine

## 2018-11-12 DIAGNOSIS — R7309 Other abnormal glucose: Secondary | ICD-10-CM

## 2018-11-12 DIAGNOSIS — Z0000 Encounter for general adult medical examination without abnormal findings: Secondary | ICD-10-CM

## 2018-11-12 NOTE — Telephone Encounter (Signed)
I ordered for lab collect [facility] future   ? If needs to be released Thanks

## 2018-11-12 NOTE — Telephone Encounter (Signed)
Pt need order for her labs to be done at [facility] for her up coming CPE scheduled for 12.6.19. Please advise pt

## 2018-11-13 NOTE — Telephone Encounter (Signed)
[name] released lab order in Epic. Called pt and advised her orders are in

## 2018-11-20 LAB — CBC WITH DIFFERENTIAL/PLATELET
BASOS ABS: 0 10*3/uL (ref 0.0–0.2)
BASOS: 1 %
EOS (ABSOLUTE): 0.2 10*3/uL (ref 0.0–0.4)
Eos: 2 %
Hematocrit: 41.2 % (ref 34.0–46.6)
Hemoglobin: 14.4 g/dL (ref 11.1–15.9)
IMMATURE GRANS (ABS): 0 10*3/uL (ref 0.0–0.1)
IMMATURE GRANULOCYTES: 0 %
Lymphocytes Absolute: 2.1 10*3/uL (ref 0.7–3.1)
Lymphs: 26 %
MCH: 31.6 pg (ref 26.6–33.0)
MCHC: 35 g/dL (ref 31.5–35.7)
MCV: 90 fL (ref 79–97)
MONOS ABS: 0.7 10*3/uL (ref 0.1–0.9)
Monocytes: 10 %
NEUTROS ABS: 4.8 10*3/uL (ref 1.4–7.0)
NEUTROS PCT: 61 %
Platelets: 253 10*3/uL (ref 150–450)
RBC: 4.56 x10E6/uL (ref 3.77–5.28)
RDW: 13.2 % (ref 12.3–15.4)
WBC: 7.8 10*3/uL (ref 3.4–10.8)

## 2018-11-21 LAB — COMPREHENSIVE METABOLIC PANEL
A/G RATIO: 2.3 — AB (ref 1.2–2.2)
ALT: 10 IU/L (ref 0–32)
AST: 16 IU/L (ref 0–40)
Albumin: 4.8 g/dL (ref 3.5–5.5)
Alkaline Phosphatase: 52 IU/L (ref 39–117)
BUN/Creatinine Ratio: 13 (ref 9–23)
BUN: 12 mg/dL (ref 6–24)
Bilirubin Total: 1.1 mg/dL (ref 0.0–1.2)
CALCIUM: 9.7 mg/dL (ref 8.7–10.2)
CO2: 22 mmol/L (ref 20–29)
Chloride: 99 mmol/L (ref 96–106)
Creatinine, Ser: 0.91 mg/dL (ref 0.57–1.00)
GFR, EST AFRICAN AMERICAN: 90 mL/min/{1.73_m2} (ref >59)
GFR, EST NON AFRICAN AMERICAN: 78 mL/min/{1.73_m2} (ref >59)
GLOBULIN, TOTAL: 2.1 g/dL (ref 1.5–4.5)
Glucose: 97 mg/dL (ref 65–99)
POTASSIUM: 4.1 mmol/L (ref 3.5–5.2)
Sodium: 136 mmol/L (ref 134–144)
TOTAL PROTEIN: 6.9 g/dL (ref 6.0–8.5)

## 2018-11-21 LAB — TSH: TSH: 3.44 u[IU]/mL (ref 0.450–4.500)

## 2018-11-21 LAB — HEMOGLOBIN A1C
Est. average glucose Bld gHb Est-mCnc: 108 mg/dL
Hgb A1c MFr Bld: 5.4 % (ref 4.8–5.6)

## 2018-11-21 LAB — LIPID PANEL
Chol/HDL Ratio: 1.9 ratio (ref 0.0–4.4)
Cholesterol, Total: 138 mg/dL (ref 100–199)
HDL: 73 mg/dL (ref >39)
LDL Calculated: 55 mg/dL (ref 0–99)
Triglycerides: 51 mg/dL (ref 0–149)
VLDL CHOLESTEROL CAL: 10 mg/dL (ref 5–40)

## 2018-11-29 ENCOUNTER — Ambulatory Visit (INDEPENDENT_AMBULATORY_CARE_PROVIDER_SITE_OTHER): Payer: Managed Care, Other (non HMO) | Admitting: Family Medicine

## 2018-11-29 ENCOUNTER — Encounter: Payer: Self-pay | Admitting: Family Medicine

## 2018-11-29 VITALS — BP 124/78 | HR 76 | Temp 98.6°F | Ht 62.75 in | Wt 166.5 lb

## 2018-11-29 DIAGNOSIS — Z1231 Encounter for screening mammogram for malignant neoplasm of breast: Secondary | ICD-10-CM | POA: Diagnosis not present

## 2018-11-29 DIAGNOSIS — I10 Essential (primary) hypertension: Secondary | ICD-10-CM

## 2018-11-29 DIAGNOSIS — Z0000 Encounter for general adult medical examination without abnormal findings: Secondary | ICD-10-CM

## 2018-11-29 DIAGNOSIS — R7303 Prediabetes: Secondary | ICD-10-CM | POA: Diagnosis not present

## 2018-11-29 MED ORDER — LOSARTAN POTASSIUM 50 MG PO TABS: 50.0000 mg | ORAL_TABLET | Freq: Every day | ORAL | 3 refills | Status: DC

## 2018-11-29 MED ORDER — FLUTICASONE PROPIONATE 50 MCG/ACT NA SUSP: 2.0000 | Freq: Every day | NASAL | 3 refills | Status: DC

## 2018-11-29 NOTE — Patient Instructions (Addendum)
Stay off the hctz  Keep eating healthy and drinking lots of water   Ask your weight loss doctor about metformin XR (the delayed release causes less diarrhea)   When you check out we will schedule your mammogram   Keep up the great work with diet/exercise and healthy lifestyle

## 2018-11-29 NOTE — Progress Notes (Signed)
Subjective:    Patient ID: Sophia CarwinShernell Monique [name], female    DOB: 1976-01-30, 42 y.o.   MRN: [id number]  HPI Here for health maintenance exam and to review chronic medical problems    Wt Readings from Last 3 Encounters:  11/29/18 166 lb 8 oz (75.5 kg)  11/27/17 180 lb (81.6 kg)  12/21/16 180 lb (81.6 kg)  has lost 19 lb since august  In a program for wt loss  Goes to a fitness center or gym  Walks at work and at home also  Eating very healthy (small portions 1030 calories daily) -no longer needs phentermine -no longer  Taking metformin as well  29.73 kg/m   Pap 9/15 nl gyn  Has appt 12/27 with gyn  Had pap about a year ago  Has had a partial hysterectomy  (for fibroids)  Mammogram 4/18  Self breast exam - no changes or lumps   Flu vaccine - she declines   Tetanus vaccine 12/18  bp is stable today  No cp or palpitations or headaches or edema  No side effects to medicines  BP Readings from Last 3 Encounters:  11/29/18 124/78  11/27/17 132/80  12/21/16 116/72     Pulse Readings from Last 3 Encounters:  11/29/18 76  11/27/17 79  12/21/16 64    hctz 12.5 mg daily - stopped it 90 days ago (dizzy)  Losartan 50 mg daily   On metformin - some diarrhea Elevated glucose hx Lab Results  Component Value Date   HGBA1C 5.4 11/20/2018   Lab Results  Component Value Date   CREATININE 0.91 11/20/2018   BUN 12 11/20/2018   NA 136 11/20/2018   K 4.1 11/20/2018   CL 99 11/20/2018   CO2 22 11/20/2018    Lab Results  Component Value Date   ALT 10 11/20/2018   AST 16 11/20/2018   ALKPHOS 52 11/20/2018   BILITOT 1.1 11/20/2018    Lab Results  Component Value Date   TSH 3.440 11/20/2018    Lab Results  Component Value Date   WBC 7.8 11/20/2018   HGB 14.4 11/20/2018   HCT 41.2 11/20/2018   MCV 90 11/20/2018   PLT 253 11/20/2018     Cholesterol Lab Results  Component Value Date   CHOL 138 11/20/2018   CHOL 173 11/22/2017   CHOL 177 11/20/2016   Lab  Results  Component Value Date   HDL 73 11/20/2018   HDL 58 11/22/2017   HDL 77 11/20/2016   Lab Results  Component Value Date   LDLCALC 55 11/20/2018   LDLCALC 97 11/22/2017   LDLCALC 79 11/20/2016   Lab Results  Component Value Date   TRIG 51 11/20/2018   TRIG 88 11/22/2017   TRIG 106 11/20/2016   Lab Results  Component Value Date   CHOLHDL 1.9 11/20/2018   CHOLHDL 3.0 11/22/2017   CHOLHDL 2.3 11/20/2016   No results found for: LDLDIRECT Very good with better diet   Patient Active Problem List   Diagnosis Date Noted  . Elevated glucose level 11/27/2017  . Snoring 11/24/2016  . Screening mammogram, encounter for 11/24/2016  . Sleep apnea 01/03/2016  . Routine general medical examination at a health care facility 09/26/2013  . Essential hypertension 09/16/2012  . 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 Allergic rhinitis 07/02/2007  . GERD 07/02/2007   Past Medical History:  Diagnosis Date  . Abnormal Pap smear    cin-1  . Allergic rhinitis    seasonal  .  GERD (gastroesophageal reflux disease)   . 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 Hypertension    Past Surgical History:  Procedure Laterality Date  . CERVIX LESION DESTRUCTION    . COLPOSCOPY  1997   abnormal pap  . LAMINECTOMY AND MICRODISCECTOMY LUMBAR SPINE  12/2007   L5-S1  . PARTIAL HYSTERECTOMY  11/2005   bleeding  . WISDOM TOOTH EXTRACTION     Social History   Tobacco Use  . Smoking status: Never Smoker  . Smokeless tobacco: Never Used  Substance Use Topics  . Alcohol use: Yes    Alcohol/week: 0.0 standard drinks    Comment: social  . Drug use: No   Family History  Problem Relation Age of Onset  . Hypertension Mother 49  . Hypertension Father   . Diabetes Paternal Grandfather   . Breast cancer Maternal Grandmother   . Colon cancer Other        MGaunt  . Stroke Paternal Grandmother    Allergies  Allergen Reactions  . Lisinopril     Cough   . Morphine      REACTION: skin crawling  . Oxycodone Itching  . Rofecoxib     REACTION: blurred vision, dizziness   Current Outpatient Medications on File Prior to Visit  Medication Sig Dispense Refill  . acetaminophen (TYLENOL) 325 MG tablet Take 650 mg by mouth every 6 (six) hours as needed.    . Cyanocobalamin 1000 MCG/ML LIQD Inject 1 mL as directed every 21 ( twenty-one) days.    . cyclobenzaprine (FLEXERIL) 10 MG tablet Take 1 tablet (10 mg total) by mouth 3 (three) times daily as needed for muscle spasms (caution this may sedate). 30 tablet 1  . ibuprofen (ADVIL,MOTRIN) 200 MG tablet Take 200 mg by mouth every 6 (six) hours as needed.    . metFORMIN (GLUCOPHAGE) 500 MG tablet Take 1 tablet by mouth 2 (two) times daily.    . Multiple Vitamin (MULTIVITAMIN) capsule Take 1 capsule by mouth daily.    . NON FORMULARY every 21 ( twenty-one) days. Lipotropic injection    . valACYclovir (VALTREX) 1000 MG tablet TAKE 1/2 TABLET  BY MOUTH 2 TIMES DAILY AS NEEDED. 90 tablet 1   No current facility-administered medications on file prior to visit.     Review of Systems  Constitutional: Negative for activity change, appetite change, fatigue, fever and unexpected weight change.  HENT: Negative for congestion, ear pain, rhinorrhea, sinus pressure and sore throat.   Eyes: Negative for pain, redness and visual disturbance.  Respiratory: Negative for cough, shortness of breath and wheezing.   Cardiovascular: Negative for chest pain and palpitations.  Gastrointestinal: Positive for diarrhea. Negative for abdominal pain, blood in stool and constipation.  Endocrine: Negative for polydipsia and polyuria.  Genitourinary: Negative for dysuria, frequency and urgency.  Musculoskeletal: Negative for arthralgias, back pain and myalgias.  Skin: Negative for pallor and rash.  Allergic/Immunologic: Negative for environmental allergies.  Neurological: Negative for dizziness, syncope and headaches.  Hematological: Negative for  adenopathy. Does not bruise/bleed easily.  Psychiatric/Behavioral: Negative for decreased concentration and dysphoric mood. The patient is not nervous/anxious.        Objective:   Physical Exam  Constitutional: She appears well-developed and well-nourished. No distress.  obese and well appearing   HENT:  Head: Normocephalic and atraumatic.  Right Ear: External ear normal.  Left Ear: External ear normal.  Mouth/Throat: Oropharynx is clear and moist.  Eyes: Pupils are equal, round, and  reactive to light. Conjunctivae and EOM are normal. No scleral icterus.  Neck: Normal range of motion. Neck supple. No JVD present. Carotid bruit is not present. No thyromegaly present.  Cardiovascular: Normal rate, regular rhythm, normal heart sounds and intact distal pulses. Exam reveals no gallop.  Pulmonary/Chest: Effort normal and breath sounds normal. No respiratory distress. She has no wheezes. She has no rales. She exhibits no tenderness. No breast tenderness, discharge or bleeding.  Abdominal: Soft. Bowel sounds are normal. She exhibits no distension, no abdominal bruit and no mass. There is no tenderness.  Genitourinary: No breast tenderness, discharge or bleeding.  Genitourinary Comments: Breast exam: No mass, nodules, thickening, tenderness, bulging, retraction, inflamation, nipple discharge or skin changes noted.  No axillary or clavicular LA.      Musculoskeletal: Normal range of motion. She exhibits no edema or tenderness.  Lymphadenopathy:    She has no cervical adenopathy.  Neurological: She is alert. She has normal reflexes. She displays normal reflexes. No cranial nerve deficit. She exhibits normal muscle tone. Coordination normal.  Skin: Skin is warm and dry. No rash noted. No erythema. No pallor.  Some skin tags  Psychiatric: She has a normal mood and affect.          Assessment & Plan:   Problem List Items Addressed This Visit      Cardiovascular and Mediastinum   Essential  hypertension    bp in fair control at this time  BP Readings from Last 1 Encounters:  11/29/18 124/78   No changes needed Most recent labs reviewed  Disc lifstyle change with low sodium diet and exercise        Relevant Medications   losartan (COZAAR) 50 MG tablet     Other   Screening mammogram, encounter for    Scheduled annual screening mammogram Nl breast exam today  Encouraged monthly self exams        Relevant Orders   MM 3D SCREEN BREAST BILATERAL   Routine general medical examination at a health care facility - Primary    Reviewed health habits including diet and exercise and skin cancer prevention Reviewed appropriate screening tests for age  Also reviewed health mt list, fam hx and immunization status , as well as social and family history   See HPI Labs reviewed  Commended wt loss  Mammogram scheduled      Prediabetes    Lab Results  Component Value Date   HGBA1C 5.4 11/20/2018   Commended on wt loss  disc imp of low glycemic diet and wt loss to prevent DM2 on metformin from bariatric provider (some diarrhea- may consider change to XR)

## 2018-12-01 NOTE — Assessment & Plan Note (Addendum)
Lab Results  Component Value Date   HGBA1C 5.4 11/20/2018   Commended on wt loss  disc imp of low glycemic diet and wt loss to prevent DM2 on metformin from bariatric provider (some diarrhea- may consider change to XR)

## 2018-12-01 NOTE — Assessment & Plan Note (Signed)
bp in fair control at this time  BP Readings from Last 1 Encounters:  11/29/18 124/78   No changes needed Most recent labs reviewed  Disc lifstyle change with low sodium diet and exercise

## 2018-12-01 NOTE — Assessment & Plan Note (Signed)
Scheduled annual screening mammogram Nl breast exam today  Encouraged monthly self exams   

## 2018-12-01 NOTE — Assessment & Plan Note (Signed)
Reviewed health habits including diet and exercise and skin cancer prevention Reviewed appropriate screening tests for age  Also reviewed health mt list, fam hx and immunization status , as well as social and family history   See HPI Labs reviewed  Commended wt loss  Mammogram scheduled

## 2019-01-06 ENCOUNTER — Ambulatory Visit
Admission: RE | Admit: 2019-01-06 | Discharge: 2019-01-06 | Disposition: A | Discharge status: Home / Self Care | Payer: Managed Care, Other (non HMO) | Source: Ambulatory Visit | Attending: Family Medicine | Admitting: Family Medicine

## 2019-01-06 DIAGNOSIS — Z1231 Encounter for screening mammogram for malignant neoplasm of breast: Secondary | ICD-10-CM

## 2019-02-27 ENCOUNTER — Ambulatory Visit (INDEPENDENT_AMBULATORY_CARE_PROVIDER_SITE_OTHER): Payer: Managed Care, Other (non HMO) | Admitting: Family Medicine

## 2019-02-27 ENCOUNTER — Encounter: Payer: Self-pay | Admitting: Family Medicine

## 2019-02-27 ENCOUNTER — Telehealth: Payer: Self-pay

## 2019-02-27 VITALS — BP 130/92 | HR 102 | Temp 98.4°F | Ht 62.75 in | Wt 164.8 lb

## 2019-02-27 DIAGNOSIS — R1084 Generalized abdominal pain: Secondary | ICD-10-CM

## 2019-02-27 DIAGNOSIS — R1013 Epigastric pain: Secondary | ICD-10-CM

## 2019-02-27 DIAGNOSIS — K529 Noninfective gastroenteritis and colitis, unspecified: Secondary | ICD-10-CM

## 2019-02-27 LAB — POCT URINALYSIS DIPSTICK
BILIRUBIN UA: POSITIVE
Blood, UA: POSITIVE
Glucose, UA: NEGATIVE
KETONES UA: POSITIVE
Nitrite, UA: NEGATIVE
Protein, UA: POSITIVE — AB
Spec Grav, UA: 1.025 (ref 1.010–1.025)
Urobilinogen, UA: 0.2 E.U./dL
pH, UA: 6 (ref 5.0–8.0)

## 2019-02-27 NOTE — Telephone Encounter (Signed)
Seen today. See separate note

## 2019-02-27 NOTE — Telephone Encounter (Signed)
Called pt to discuss results of CT Scan.   Labs not yet back.   Discussed that CT scan had multiple findings as previously listed.   Most likely cause of symptoms are the inflammatory changes and the fecal retention.   Plan:  1) Stool softener - Miralax or Colace 2) Increase fluids 3) Make an appointment for next week to be seen  Offered pain reliever and antiemetic -- but patient declined will watch and wait as some improvement in symptoms and no severe nausea currently.   Informed pt of   1) Bartholin cyst - she was aware, not an issue currently 2) Ovarian cyst - likely not the cause of symptoms 3) Renal cyst - recommended PCP follow-up, and will follow-up kidney function labs.    Routing to PCP as Lorain Childes

## 2019-02-27 NOTE — Telephone Encounter (Signed)
[name] from [facility] called report on CT of abdomen and pelvis with IV contrast;  1) Inflammatory changes involving the terminal ileum and distal ileum. Suggesting inflammatory or infectious bowel disease associated with ascites. 2) Incidental subcentimeter left renal hypodense lesion suggesting an aml or cyst with adjacent calculus. 3)Hysterectomy 4)Rt vaginal bartholin cyst 5) 10 mm L ovarian cyst 6) fecal retention 7)DDD L5 - S1  Dr Selena Batten said pt could leave and pt will be called at home. [name] voiced understanding and will let pt know.

## 2019-02-27 NOTE — Progress Notes (Signed)
Subjective:     [name] is a 43 y.o. female presenting for Abdominal Pain (started yesterday. Pain is present off and on in the upper abdomen area, some cramping in the lower abdomen. Vomited 1 time this morning, but just mucus came up. No fever. no diarrhea. No body aches.)     Abdominal Pain  This is a new problem. The current episode started yesterday. The problem occurs intermittently. The problem has been unchanged. The pain is located in the epigastric region and periumbilical region. The pain is severe. The quality of the pain is cramping, aching and sharp. The abdominal pain does not radiate. Associated symptoms include flatus, nausea and vomiting. Pertinent negatives include no anorexia, belching, constipation, diarrhea, dysuria, fever, frequency, hematuria or myalgias. The pain is aggravated by certain positions (vibration of the car). The pain is relieved by nothing. She has tried antacids for the symptoms. The treatment provided no relief. Her past medical history is significant for abdominal surgery.   Comes on like labor pains  Nothing seems to make it better  Review of Systems  Constitutional: Negative for fever.  Gastrointestinal: Positive for abdominal pain, flatus, nausea and vomiting. Negative for anorexia, constipation and diarrhea.  Genitourinary: Negative for dysuria, frequency and hematuria.  Musculoskeletal: Negative for myalgias.   02/27/2019: Telephone - Abd pain and emesis worse with laying down  Social History   Tobacco Use  Smoking Status Never Smoker  Smokeless Tobacco Never Used        Objective:    BP Readings from Last 3 Encounters:  02/27/19 (!) 130/92  11/29/18 124/78  11/27/17 132/80   Wt Readings from Last 3 Encounters:  02/27/19 164 lb 12 oz (74.7 kg)  11/29/18 166 lb 8 oz (75.5 kg)  11/27/17 180 lb (81.6 kg)    BP (!) 130/92   Pulse (!) 102   Temp 98.4 F (36.9 C)   Ht 5' 2.75" (1.594 m)   Wt 164 lb 12 oz  (74.7 kg)   LMP 05/09/2006   SpO2 98%   BMI 29.42 kg/m    Physical Exam Constitutional:      General: She is not in acute distress.    Appearance: She is well-developed. She is not diaphoretic.     Comments: Standing and changing position to get comfortable  HENT:     Right Ear: External ear normal.     Left Ear: External ear normal.     Nose: Nose normal.  Eyes:     General: No scleral icterus.    Conjunctiva/sclera: Conjunctivae normal.  Neck:     Musculoskeletal: Neck supple.  Cardiovascular:     Rate and Rhythm: Regular rhythm. Tachycardia present.     Heart sounds: No murmur.  Pulmonary:     Effort: Pulmonary effort is normal. No respiratory distress.     Breath sounds: Normal breath sounds. No wheezing or rales.  Abdominal:     General: Abdomen is flat. Bowel sounds are decreased. There is no distension.     Palpations: Abdomen is soft. There is no hepatomegaly.     Tenderness: There is abdominal tenderness in the right lower quadrant, epigastric area, suprapubic area and left lower quadrant. There is guarding. There is no right CVA tenderness, left CVA tenderness or rebound. Negative signs include Murphy's sign.  Skin:    General: Skin is warm and dry.     Capillary Refill: Capillary refill takes less than 2 seconds.  Neurological:     Mental  Status: She is alert. Mental status is at baseline.  Psychiatric:        Mood and Affect: Mood normal.        Behavior: Behavior normal.    UA: +blood, LE, protein       Assessment & Plan:   Problem List Items Addressed This Visit    None    Visit Diagnoses    Abdominal pain, acute, epigastric    -  Primary   Relevant Orders   CT Abdomen Pelvis W Contrast   POCT urinalysis dipstick (Completed)   CBC with Differential   Comprehensive metabolic panel   Lipase     Etiology unclear but with some peritoneal signs and significant pain with exam. UA with blood and colicky symptoms point to kidney stone but at this  point with diffuse pain cannot rule out infectious cause. Labs and stat CT to evaluate.    Return if symptoms worsen or fail to improve.  Lynnda Child, MD

## 2019-02-27 NOTE — Telephone Encounter (Signed)
Pt started last night with upper abd pain on and off; no chest pain. When pt has pain the pain level is 10. Pt is not having any pain at this time but last night pain was worse when laying down. Pt felt like she needed to pass gas but could not. Pt vomited x 1 at 7:45 AM but only vomited mucus. Pt has not had a cough. No fever. Pt request early morning appt. Pt scheduled appt with Dr Selena Batten 02/27/19 at 9 AM.

## 2019-02-27 NOTE — Patient Instructions (Addendum)
Not sure what is causing your abdominal pain. But I am concerned that it is something more serious than constipation.    I would like you to get labs today and a CT scan to look for signs of infection in your abdomen.    Please go to the lab and then meet with Shirlee Limerick (referral coordinator) to help schedule the CT scan before you leave today.

## 2019-02-28 LAB — CBC WITH DIFFERENTIAL/PLATELET
Basophils Absolute: 0 10*3/uL (ref 0.0–0.2)
Basos: 0 %
EOS (ABSOLUTE): 0.2 10*3/uL (ref 0.0–0.4)
Eos: 2 %
Hematocrit: 42.5 % (ref 34.0–46.6)
Hemoglobin: 15.1 g/dL (ref 11.1–15.9)
Immature Grans (Abs): 0 10*3/uL (ref 0.0–0.1)
Immature Granulocytes: 0 %
Lymphocytes Absolute: 1.3 10*3/uL (ref 0.7–3.1)
Lymphs: 11 %
MCH: 31.6 pg (ref 26.6–33.0)
MCHC: 35.5 g/dL (ref 31.5–35.7)
MCV: 89 fL (ref 79–97)
MONOS ABS: 0.6 10*3/uL (ref 0.1–0.9)
Monocytes: 5 %
Neutrophils Absolute: 9.9 10*3/uL — ABNORMAL HIGH (ref 1.4–7.0)
Neutrophils: 82 %
Platelets: 281 10*3/uL (ref 150–450)
RBC: 4.78 x10E6/uL (ref 3.77–5.28)
RDW: 12.5 % (ref 11.7–15.4)
WBC: 12 10*3/uL — ABNORMAL HIGH (ref 3.4–10.8)

## 2019-02-28 LAB — COMPREHENSIVE METABOLIC PANEL
ALT: 10 IU/L (ref 0–32)
AST: 14 IU/L (ref 0–40)
Albumin/Globulin Ratio: 1.9 (ref 1.2–2.2)
Albumin: 4.6 g/dL (ref 3.8–4.8)
Alkaline Phosphatase: 53 IU/L (ref 39–117)
BILIRUBIN TOTAL: 0.6 mg/dL (ref 0.0–1.2)
BUN/Creatinine Ratio: 13 (ref 9–23)
BUN: 9 mg/dL (ref 6–24)
CO2: 21 mmol/L (ref 20–29)
Calcium: 9 mg/dL (ref 8.7–10.2)
Chloride: 105 mmol/L (ref 96–106)
Creatinine, Ser: 0.7 mg/dL (ref 0.57–1.00)
GFR calc Af Amer: 123 mL/min/{1.73_m2} (ref >59)
GFR calc non Af Amer: 106 mL/min/{1.73_m2} (ref >59)
Globulin, Total: 2.4 g/dL (ref 1.5–4.5)
Glucose: 118 mg/dL — ABNORMAL HIGH (ref 65–99)
Potassium: 4.5 mmol/L (ref 3.5–5.2)
Sodium: 139 mmol/L (ref 134–144)
Total Protein: 7 g/dL (ref 6.0–8.5)

## 2019-02-28 LAB — LIPASE: Lipase: 35 U/L (ref 14–72)

## 2019-02-28 NOTE — Telephone Encounter (Signed)
Pt notified of all of Dr. Royden Purl instructions and she verbalized understanding. Pt will update [place] on Monday. Pt also said she does want to proceed with GI referral, I advised pt our [facility] will call to schedule that appt

## 2019-02-28 NOTE — Telephone Encounter (Signed)
Magnesium citrate may be too powerful and cause a lot of pain  I would continue dulcolax or other stool softener  Take miralax 2-3 times daily with fluid- this takes a few days to work but does work well with above   Please ask her if any fever/chills or n/v  Is pain any worse or just the same ?

## 2019-02-28 NOTE — Telephone Encounter (Signed)
Pt notified of Dr. Royden Purl comments. Pt said that the dulcolax is starting to work so she may hold off on an enema, she said she is having small BM but that are getting looser so the constipation has improved some. Pt said that she has no fever/chills and no n/v since she saw Dr. Selena Batten. Pt said that pain is the same but the location is different. At OV pt's pain was more in her upper abd above her belly button but now she is having lower abd pain. Pt said moving makes the pain worse but if she is just laying down or sitting she isn't having any pain. Pt said she did take tylenol and it helped just a little bit with her pain

## 2019-02-28 NOTE — Telephone Encounter (Signed)
Thanks for the update  Continue what she is doing for constipation and miralax is good also  Make sure she is keeping up a good fluid intake  If pain worsens-want her to go to the ER (also if fever or n/v)  Please update [place] on Monday  I do also want to start the process of a GI referral- is she ok with that?

## 2019-02-28 NOTE — Telephone Encounter (Signed)
Pt took Dulcolax liquid last night and again this morning with very small amt of liquid return but no solid BM. Pt has not eaten since Wed. Pt has been drinking water. Pt still having the same abd pain as when seen. Pt wants to know if she could try magnesium citrate. Pt request cb. [facility]. Dr Selena Batten out of office and will send to Dr Selena Batten and Dr Milinda Antis.

## 2019-03-02 DIAGNOSIS — K529 Noninfective gastroenteritis and colitis, unspecified: Secondary | ICD-10-CM | POA: Insufficient documentation

## 2019-03-02 DIAGNOSIS — R109 Unspecified abdominal pain: Secondary | ICD-10-CM | POA: Insufficient documentation

## 2019-03-02 NOTE — Telephone Encounter (Signed)
Urgent ref to GI Extender is ok

## 2019-03-02 NOTE — Addendum Note (Signed)
Addended by: Roxy Manns A on: 03/02/2019 02:31 PM   Modules accepted: Orders

## 2019-03-04 ENCOUNTER — Ambulatory Visit (INDEPENDENT_AMBULATORY_CARE_PROVIDER_SITE_OTHER): Payer: Managed Care, Other (non HMO) | Admitting: Family Medicine

## 2019-03-04 ENCOUNTER — Encounter: Payer: Self-pay | Admitting: Family Medicine

## 2019-03-04 VITALS — BP 126/72 | HR 76 | Temp 98.7°F | Ht 62.75 in | Wt 164.6 lb

## 2019-03-04 DIAGNOSIS — R3129 Other microscopic hematuria: Secondary | ICD-10-CM | POA: Diagnosis not present

## 2019-03-04 DIAGNOSIS — K529 Noninfective gastroenteritis and colitis, unspecified: Secondary | ICD-10-CM | POA: Diagnosis not present

## 2019-03-04 DIAGNOSIS — R1084 Generalized abdominal pain: Secondary | ICD-10-CM | POA: Diagnosis not present

## 2019-03-04 LAB — POC URINALSYSI DIPSTICK (AUTOMATED)
Bilirubin, UA: NEGATIVE
Glucose, UA: NEGATIVE
Ketones, UA: NEGATIVE
Leukocytes, UA: NEGATIVE
Nitrite, UA: NEGATIVE
Protein, UA: NEGATIVE
RBC UA: NEGATIVE
Spec Grav, UA: 1.025 (ref 1.010–1.025)
Urobilinogen, UA: 0.2 E.U./dL
pH, UA: 6 (ref 5.0–8.0)

## 2019-03-04 NOTE — Assessment & Plan Note (Signed)
No ureteral renal stones on CT No pain  Re check UA today

## 2019-03-04 NOTE — Assessment & Plan Note (Signed)
This was seen on CT scan after bout of abdominal pain  Symptomatic improvement with tx of constipation  Plan to ref to GI for further eval  Pt will watch for return of pain or stool change or blood in stool

## 2019-03-04 NOTE — Patient Instructions (Addendum)
If you are not having bowel movements regularly-take the dulcolax  Keep drinking lots of fluids  If blood in stool let us know   If your pain returns let us know   We will re check urine today   We will do a referral to GI to discuss the CT finding of colitis  Our office will call you about it

## 2019-03-04 NOTE — Assessment & Plan Note (Signed)
Resolved with tx of constipation  findins of inflammatory change in ilium on CT scan  (also ovarian cyst but pt denies pelvic pain)  Will update if this returns Ref to GI done

## 2019-03-04 NOTE — Progress Notes (Signed)
Subjective:    Patient ID: Ellison CarwinShernell Monique Briere, female    DOB: [birth year], 43 y.o.   MRN: [id number]  HPI Here for f/u of GI issues and recent CT   Wt Readings from Last 3 Encounters:  03/04/19 164 lb 9 oz (74.6 kg)  02/27/19 164 lb 12 oz (74.7 kg)  11/29/18 166 lb 8 oz (75.5 kg)   29.38 kg/m   She was seen by Dr Selena Battenody early this month for abdominal pain and vomiting times one  She was tender on exam  CT and labs were ordered   UA showed blood and LE and protein (wanted to r/o renal stone passage)   Lab Results  Component Value Date   WBC 12.0 (H) 02/27/2019   HGB 15.1 02/27/2019   HCT 42.5 02/27/2019   MCV 89 02/27/2019   PLT 281 02/27/2019   Lab Results  Component Value Date   CREATININE 0.70 02/27/2019   BUN 9 02/27/2019   NA 139 02/27/2019   K 4.5 02/27/2019   CL 105 02/27/2019   CO2 21 02/27/2019   Lab Results  Component Value Date   ALT 10 02/27/2019   AST 14 02/27/2019   ALKPHOS 53 02/27/2019   BILITOT 0.6 02/27/2019   Lab Results  Component Value Date   LIPASE 35 02/27/2019    Nl except for mildly elevated wbc    Her CT scan ([facility]): CT Abdomen Pelvis W IV Contrast3/04/2019 [facility] Result Impression  IMPRESSION: 1. Inflammatory change involving the terminal ileum and distal ileum suggesting inflammatory or infectious bowel disease with associated ascites. 2. 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 3. Hysterectomy 4. Right vaginal Bartholin cyst 5. 19 mm left ovarian cyst 6. Fecal retention 7. DDD L5-S1.  Electronically Signed by: Bryna ColanderSam Auringer  Result Narrative  INDICATION: Epigastric pain. COMPARISON: None.  TECHNIQUE: CT ABDOMEN PELVIS W IV CONTRAST - 80 mL Isovue 370 were administered intravenously Radiation dose reduction was utilized (automated exposure control, mA or kV adjustment based on patient size, or iterative image  reconstruction).  FINDINGS:  CT ABDOMEN: Lung bases clear. Heart size normal. Normal liver, gallbladder, biliary tree, pancreas, spleen, adrenal glands, and right kidney. 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 Large volume of ascites.  CT PELVIS: Normal bladder and vaginal cuff status post hysterectomy. Right vaginal Bartholin's cyst. 19 mm left ovarian cyst. Normal right ovary. Moderate pelvic ascites. Stool throughout the colon. No evidence for diverticulitis or appendicitis.  Inflammatory change involving the terminal ileum and distal ileum with transition to normal small bowel in the anterior pelvis. No pelvic mass or adenopathy. DDD L5-S1.  Other Result Information  Acute Interface, Incoming Rad Results - 02/27/2019  3:58 PM EST INDICATION: Epigastric pain. COMPARISON:  None.   TECHNIQUE:  CT ABDOMEN PELVIS W IV CONTRAST - 80 mL Isovue 370 were administered intravenously  Radiation dose reduction was utilized (automated exposure control, mA or kV adjustment based on patient size, or iterative image reconstruction).  FINDINGS:  CT ABDOMEN: Lung bases clear. Heart size normal. Normal liver, gallbladder, biliary tree, pancreas, spleen, adrenal glands, and right kidney. 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 Large volume of ascites.  CT PELVIS: Normal bladder and vaginal cuff status post hysterectomy. Right vaginal Bartholin's cyst. 19 mm left ovarian cyst. Normal right ovary. Moderate pelvic ascites. Stool throughout  the colon. No evidence for diverticulitis or appendicitis.  Inflammatory change involving the terminal ileum and distal ileum with transition to normal small bowel in the anterior pelvis. No pelvic mass or adenopathy. DDD L5-S1.   IMPRESSION: 1. Inflammatory change involving the terminal ileum and distal ileum  suggesting inflammatory or infectious bowel disease with associated ascites. 2. 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 3. Hysterectomy 4. Right vaginal Bartholin cyst 5. 19 mm left ovarian cyst 6. Fecal retention 7. DDD L5-S1.  Electronically Signed by: Bryna Colander  Status   She was inst to take miralax or colace for constipation and inc fluids   Symptoms improved now greatly  Once she got bowels moving - with dulcolax is helped  Was sore Sunday Better today  Just gassy and bloated No blood in stool   Has not thought she was struggling with constipation (in fact took metformin that causes her to move bowels)   No urinary symptoms  Drinks lots of water and nothing else   Results for orders placed or performed in visit on 03/04/19  POCT Urinalysis Dipstick (Automated)  Result Value Ref Range   Color, UA Yellow    Clarity, UA Clear    Glucose, UA Negative Negative   Bilirubin, UA Negative    Ketones, UA Negative    Spec Grav, UA 1.025 1.010 - 1.025   Blood, UA Negative    pH, UA 6.0 5.0 - 8.0   Protein, UA Negative Negative   Urobilinogen, UA 0.2 0.2 or 1.0 E.U./dL   Nitrite, UA Negative    Leukocytes, UA Negative Negative   improved   Distant relatives with colon cancer and polyps (great great gm)   Patient Active Problem List   Diagnosis Date Noted  . Hematuria, microscopic 03/04/2019  . Abdominal pain 03/02/2019  . Colitis 03/02/2019  . Prediabetes 11/27/2017  . Snoring 11/24/2016  . Screening mammogram, encounter for 11/24/2016  . Sleep apnea 01/03/2016  . Routine general medical examination at a health care facility 09/26/2013  . Essential hypertension 09/16/2012  . 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 Allergic rhinitis 07/02/2007  . GERD 07/02/2007   Past Medical History:  Diagnosis Date  . Abnormal Pap smear    cin-1  . Allergic rhinitis    seasonal  . GERD (gastroesophageal reflux  disease)   . 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 Hypertension    Past Surgical History:  Procedure Laterality Date  . CERVIX LESION DESTRUCTION    . COLPOSCOPY  1997   abnormal pap  . LAMINECTOMY AND MICRODISCECTOMY LUMBAR SPINE  12/2007   L5-S1  . PARTIAL HYSTERECTOMY  11/2005   bleeding  . WISDOM TOOTH EXTRACTION     Social History   Tobacco Use  . Smoking status: Never Smoker  . Smokeless tobacco: Never Used  Substance Use Topics  . Alcohol use: Yes    Alcohol/week: 0.0 standard drinks    Comment: social  . Drug use: No   Family History  Problem Relation Age of Onset  . Hypertension Mother 2  . Hypertension Father   . Diabetes Paternal Grandfather   . Breast cancer Maternal Grandmother   . Colon cancer Other        MGaunt  . Stroke Paternal Grandmother    Allergies  Allergen Reactions  . Lisinopril     Cough   . Morphine     REACTION: skin crawling  .  Oxycodone Itching  . Rofecoxib     REACTION: blurred vision, dizziness   Current Outpatient Medications on File Prior to Visit  Medication Sig Dispense Refill  . acetaminophen (TYLENOL) 325 MG tablet Take 650 mg by mouth every 6 (six) hours as needed.    . Cyanocobalamin 1000 MCG/ML LIQD Inject 1 mL as directed every 21 ( twenty-one) days.    . cyclobenzaprine (FLEXERIL) 10 MG tablet Take 1 tablet (10 mg total) by mouth 3 (three) times daily as needed for muscle spasms (caution this may sedate). 30 tablet 1  . fluticasone (FLONASE) 50 MCG/ACT nasal spray Place 2 sprays into both nostrils daily. 48 g 3  . ibuprofen (ADVIL,MOTRIN) 200 MG tablet Take 200 mg by mouth every 6 (six) hours as needed.    Marland Kitchen losartan (COZAAR) 50 MG tablet Take 1 tablet (50 mg total) by mouth daily. 90 tablet 3  . metFORMIN (GLUMETZA) 500 MG (MOD) 24 hr tablet Take 500 mg by mouth daily with breakfast.    . Multiple Vitamin (MULTIVITAMIN) capsule Take 1 capsule by mouth daily.    . NON FORMULARY every 21 (  twenty-one) days. Lipotropic injection    . valACYclovir (VALTREX) 1000 MG tablet TAKE 1/2 TABLET  BY MOUTH 2 TIMES DAILY AS NEEDED. 90 tablet 1   No current facility-administered medications on file prior to visit.     Review of Systems  Constitutional: Negative for activity change, appetite change, fatigue, fever and unexpected weight change.  HENT: Negative for congestion, ear pain, rhinorrhea, sinus pressure and sore throat.   Eyes: Negative for pain, redness and visual disturbance.  Respiratory: Negative for cough, shortness of breath and wheezing.   Cardiovascular: Negative for chest pain and palpitations.  Gastrointestinal: Negative for abdominal distention, abdominal pain, anal bleeding, blood in stool, constipation, diarrhea, nausea, rectal pain and vomiting.       GI symptoms are gone   Endocrine: Negative for polydipsia and polyuria.  Genitourinary: Negative for dysuria, frequency, hematuria and urgency.       No visible blood in urine   Musculoskeletal: Negative for arthralgias, back pain and myalgias.  Skin: Negative for pallor and rash.  Allergic/Immunologic: Negative for environmental allergies.  Neurological: Negative for dizziness, syncope and headaches.  Hematological: Negative for adenopathy. Does not bruise/bleed easily.  Psychiatric/Behavioral: Negative for decreased concentration and dysphoric mood. The patient is not nervous/anxious.        Objective:   Physical Exam Constitutional:      General: She is not in acute distress.    Appearance: Normal appearance. She is well-developed. She is obese. She is not ill-appearing.  HENT:     Head: Normocephalic and atraumatic.     Mouth/Throat:     Mouth: Mucous membranes are moist.     Pharynx: Oropharynx is clear. No posterior oropharyngeal erythema.  Eyes:     General: No scleral icterus.    Conjunctiva/sclera: Conjunctivae normal.     Pupils: Pupils are equal, round, and reactive to light.  Neck:      Musculoskeletal: Normal range of motion and neck supple.  Cardiovascular:     Rate and Rhythm: Normal rate and regular rhythm.     Pulses: Normal pulses.     Heart sounds: Normal heart sounds.  Pulmonary:     Effort: Pulmonary effort is normal. No respiratory distress.     Breath sounds: Normal breath sounds. No wheezing or rales.  Abdominal:     General: Abdomen is flat. Bowel sounds are  normal. There is no distension or abdominal bruit.     Palpations: Abdomen is soft. There is no hepatomegaly, splenomegaly, mass or pulsatile mass.     Tenderness: There is no abdominal tenderness. There is right CVA tenderness. There is no left CVA tenderness, guarding or rebound.  Lymphadenopathy:     Cervical: No cervical adenopathy.  Skin:    General: Skin is warm and dry.     Coloration: Skin is not pale.     Findings: No erythema or rash.  Neurological:     General: No focal deficit present.     Mental Status: She is alert.     Coordination: Coordination normal.     Deep Tendon Reflexes: Reflexes normal.  Psychiatric:        Mood and Affect: Mood normal.           Assessment & Plan:   Problem List Items Addressed This Visit      Digestive   Colitis - Primary    This was seen on CT scan after bout of abdominal pain  Symptomatic improvement with tx of constipation  Plan to ref to GI for further eval  Pt will watch for return of pain or stool change or blood in stool      Relevant Orders   Ambulatory referral to Gastroenterology     Genitourinary   Hematuria, microscopic    No ureteral renal stones on CT No pain  Re check UA today      Relevant Orders   POCT Urinalysis Dipstick (Automated) (Completed)     Other   Abdominal pain    Resolved with tx of constipation  findins of inflammatory change in ilium on CT scan  (also ovarian cyst but pt denies pelvic pain)  Will update if this returns Ref to GI done      Relevant Orders   Ambulatory referral to  Gastroenterology   POCT Urinalysis Dipstick (Automated) (Completed)

## 2019-03-07 ENCOUNTER — Ambulatory Visit: Payer: Managed Care, Other (non HMO) | Admitting: Family Medicine

## 2019-03-10 ENCOUNTER — Other Ambulatory Visit: Payer: Self-pay

## 2019-03-10 ENCOUNTER — Ambulatory Visit (INDEPENDENT_AMBULATORY_CARE_PROVIDER_SITE_OTHER): Payer: Managed Care, Other (non HMO) | Admitting: Gastroenterology

## 2019-03-10 ENCOUNTER — Encounter: Payer: Self-pay | Admitting: Gastroenterology

## 2019-03-10 ENCOUNTER — Telehealth: Payer: Self-pay | Admitting: Gastroenterology

## 2019-03-10 VITALS — BP 110/70 | HR 72 | Temp 98.4°F | Ht 62.5 in | Wt 167.0 lb

## 2019-03-10 DIAGNOSIS — R109 Unspecified abdominal pain: Secondary | ICD-10-CM

## 2019-03-10 DIAGNOSIS — K219 Gastro-esophageal reflux disease without esophagitis: Secondary | ICD-10-CM

## 2019-03-10 DIAGNOSIS — R9389 Abnormal findings on diagnostic imaging of other specified body structures: Secondary | ICD-10-CM | POA: Diagnosis not present

## 2019-03-10 MED ORDER — SUPREP BOWEL PREP KIT 17.5-3.13-1.6 GM/177ML PO SOLN: ORAL | 0 refills | Status: DC

## 2019-03-10 NOTE — Patient Instructions (Signed)
If you are age 43 or older, your body mass index should be between 23-30. Your Body mass index is 30.06 kg/m. If this is out of the aforementioned range listed, please consider follow up with your Primary Care Provider.  If you are age 7 or younger, your body mass index should be between 19-25. Your Body mass index is 30.06 kg/m. If this is out of the aformentioned range listed, please consider follow up with your Primary Care Provider.   You have been scheduled for a colonoscopy. Please follow written instructions given to you at your visit today.  Please pick up your prep supplies at the pharmacy within the next 1-3 days. If you use inhalers (even only as needed), please bring them with you on the day of your procedure. Your physician has requested that you go to www.startemmi.com and enter the access code given to you at your visit today. This web site gives a general overview about your procedure. However, you should still follow specific instructions given to you by our office regarding your preparation for the procedure.  You have been scheduled for an abdominal paracentesis at [facility] radiology (1st floor of hospital) on Wednesday, 03-12-2019 at 9:00am. Please hold your Metformin the morning of the procedure. Please arrive at least 15 minutes prior to your appointment time for registration. Should you need to reschedule this appointment for any reason, please call our office at [phone].  Thank you for entrusting me with your care and for choosing [facility], Dr. Ileene Patrick

## 2019-03-10 NOTE — Telephone Encounter (Signed)
Pt is scheduled for 4.1.20 colonoscopy and inquired if B-12 injections are OK.

## 2019-03-10 NOTE — Progress Notes (Signed)
HPI :  43 year old female with a history of GERD, HTN, allergies, here for new patient consultation for abdominal pain and abnormal CT scan, referred by Roxy Manns MD.  She reports in early March she was awoken with severe abdominal pain in her mid abdomen. It was initially an aching pain which then changed into sharp pains. The symptoms lasted for 4-5 days and then resolved on the road. Pain initially was just above the umbilicus. She had some associated nausea but no vomiting. She denies any fevers. No diarrhea at that time. She takes metformin and has regular bowel movements, usually 2-3 per day on the regimen. She denies any weight loss in general. She has not had any blood in her stools. She denies any use of NSAIDs. She's never had a colonoscopy. She denies any family history of Crohn's disease or colitis. At the time when symptoms were at their worst she was seen in the emergency department at which time she had a CT scan which showed a large volume of abdominal ascites, moderate pelvic ascites, with inflammatory changes of the ileum. Liver was normal. She had a subcentimeter left renal lesion as below. She had no anemia but a mildly elevated WBC.  She does have a history of reflux in the past since her second pregnancy. Symptoms appear mild and she does not normally take much. She has been on Prilosec in the past. She has periodic symptoms at this point. She denies any dysphagia.  CT scan abdomen / pelvis with contrast - 02/27/2019 - subcm left renal lesion, ? Cyst or renal angiomyolipoma. Large volume of ascites. Normal liver. 19mm left renal cyst, moderate pelvic ascites. Inflammation of the ileum. No colitis   Past Medical History:  Diagnosis Date  . Abnormal Pap smear    cin-1  . Allergic rhinitis    seasonal  . GERD (gastroesophageal reflux disease)   . 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 Hypertension      Past Surgical History:  Procedure Laterality  Date  . CERVIX LESION DESTRUCTION    . COLPOSCOPY  1997   abnormal pap  . LAMINECTOMY AND MICRODISCECTOMY LUMBAR SPINE  12/2007   L5-S1  . PARTIAL HYSTERECTOMY  11/2005   bleeding  . WISDOM TOOTH EXTRACTION     Family History  Problem Relation Age of Onset  . Hypertension Mother 38  . Hypertension Father   . Diabetes Paternal Grandfather   . Breast cancer Maternal Grandmother   . Colon cancer Other        M.Great aunt  . Stroke Paternal Grandmother   . Stomach cancer Neg Hx   . Pancreatic cancer Neg Hx    Social History   Tobacco Use  . Smoking status: Never Smoker  . Smokeless tobacco: Never Used  Substance Use Topics  . Alcohol use: Yes    Alcohol/week: 0.0 standard drinks    Comment: social  . Drug use: No   Current Outpatient Medications  Medication Sig Dispense Refill  . acetaminophen (TYLENOL) 325 MG tablet Take 650 mg by mouth every 6 (six) hours as needed.    . Cyanocobalamin 1000 MCG/ML LIQD Inject 1 mL as directed every 21 ( twenty-one) days.    . cyclobenzaprine (FLEXERIL) 10 MG tablet Take 1 tablet (10 mg total) by mouth 3 (three) times daily as needed for muscle spasms (caution this may sedate). 30 tablet 1  . fluticasone (FLONASE) 50 MCG/ACT nasal spray Place 2 sprays into  both nostrils daily. 48 g 3  . ibuprofen (ADVIL,MOTRIN) 200 MG tablet Take 200 mg by mouth every 6 (six) hours as needed.    Marland Kitchen losartan (COZAAR) 50 MG tablet Take 1 tablet (50 mg total) by mouth daily. 90 tablet 3  . metFORMIN (GLUMETZA) 500 MG (MOD) 24 hr tablet Take 500 mg by mouth daily with breakfast.    . Multiple Vitamin (MULTIVITAMIN) capsule Take 1 capsule by mouth daily.    . NON FORMULARY every 21 ( twenty-one) days. Lipotropic injection    . phentermine 37.5 MG capsule Take 37.5 mg by mouth every morning.    . valACYclovir (VALTREX) 1000 MG tablet TAKE 1/2 TABLET  BY MOUTH 2 TIMES DAILY AS NEEDED. 90 tablet 1   No current facility-administered medications for this visit.     Allergies  Allergen Reactions  . Lisinopril     Cough   . Morphine     REACTION: skin crawling  . Oxycodone Itching  . Rofecoxib     REACTION: blurred vision, dizziness     Review of Systems: All systems reviewed and negative except where noted in HPI.    Lab Results  Component Value Date   WBC 12.0 (H) 02/27/2019   HGB 15.1 02/27/2019   HCT 42.5 02/27/2019   MCV 89 02/27/2019   PLT 281 02/27/2019    Lab Results  Component Value Date   CREATININE 0.70 02/27/2019   BUN 9 02/27/2019   NA 139 02/27/2019   K 4.5 02/27/2019   CL 105 02/27/2019   CO2 21 02/27/2019    Lab Results  Component Value Date   ALT 10 02/27/2019   AST 14 02/27/2019   ALKPHOS 53 02/27/2019   BILITOT 0.6 02/27/2019     Physical Exam: BP 110/70   Pulse 72   Temp 98.4 F (36.9 C)   Ht 5' 2.5" (1.588 m)   Wt 167 lb (75.8 kg)   LMP 05/09/2006   BMI 30.06 kg/m  Constitutional: Pleasant,well-developed,female in no acute distress. HEENT: Normocephalic and atraumatic. Conjunctivae are normal. No scleral icterus. Neck supple.  Cardiovascular: Normal rate, regular rhythm.  Pulmonary/chest: Effort normal and breath sounds normal. No wheezing, rales or rhonchi. Abdominal: Soft, nondistended, nontender.  There are no masses palpable. No hepatomegaly. Extremities: no edema Lymphadenopathy: No cervical adenopathy noted. Neurological: Alert and oriented to person place and time. Skin: Skin is warm and dry. No rashes noted. Psychiatric: Normal mood and affect. Behavior is normal.   ASSESSMENT AND PLAN: 43 year old female here for new patient consultation regarding the following:  Abnormal CT abdomen / abdominal pain - as above, fairly acute onset abdominal pain which was severe but otherwise no other infectious symptoms or diarrhea. Her CT scan was remarkable for ileitis as well as large volume of ascites in the abdomen, and moderate ascites in the pelvis. I discussed differential for ileitis  with her as well as that of ascites. Hopefully the ascites is just reactive to inflammation in the ileum, but does warrant follow-up with an ultrasound, with paracentesis of fluid with analysis if it is persistent on imaging. We will refer her for that today. Otherwise her symptoms have resolved and she is feeling much better, I don't appreciate any obvious ascites on exam today. I'm recommending a colonoscopy to further evaluate, obtain ileal intubation and ensure no evidence of Crohn's disease. Hopefully that is not the case and this was an atypical presentation of infectious ileitis. Following discussion of the risks / benefits of  colonoscopy she wanted to proceed. Further recommendations pending the results.  GERD - mild intermittent symptoms, will recommend Pepcid 20 mg when necessary. She can follow-up as needed for this issue. No need for EGD at this time  Sophia Patrick, MD [facility]  CC: Tower, Audrie Gallus, MD

## 2019-03-11 NOTE — Telephone Encounter (Signed)
Called and left message for pt that B12 injections are fine. No need to stop in light of scheduled colonoscopy.

## 2019-03-12 ENCOUNTER — Other Ambulatory Visit: Payer: Self-pay

## 2019-03-12 ENCOUNTER — Ambulatory Visit (HOSPITAL_COMMUNITY)
Admission: RE | Admit: 2019-03-12 | Discharge: 2019-03-12 | Disposition: A | Discharge status: Home / Self Care | Payer: Managed Care, Other (non HMO) | Source: Ambulatory Visit | Attending: Gastroenterology | Admitting: Gastroenterology

## 2019-03-12 ENCOUNTER — Other Ambulatory Visit: Payer: Self-pay | Admitting: Gastroenterology

## 2019-03-12 ENCOUNTER — Telehealth: Payer: Self-pay | Admitting: Gastroenterology

## 2019-03-12 DIAGNOSIS — R109 Unspecified abdominal pain: Secondary | ICD-10-CM | POA: Insufficient documentation

## 2019-03-12 DIAGNOSIS — R9389 Abnormal findings on diagnostic imaging of other specified body structures: Secondary | ICD-10-CM

## 2019-03-12 NOTE — Telephone Encounter (Signed)
Faxed Suprep voucher for "Pay No More than $50" to [facility] at [phone]

## 2019-03-12 NOTE — Telephone Encounter (Signed)
Pt did not get a coupon for suprep and would like to try that first before considering a different prep.

## 2019-03-12 NOTE — Progress Notes (Signed)
Request to IR for diagnostic and therapeutic paracentesis based on prior CT scan noting large volume ascites in the abdomen.   Limited abdominal US shows no ascitic fluid today. No procedure was performed.  Please call IR with questions or concerns.  Lynnette Caffey, PA-C

## 2019-03-12 NOTE — Telephone Encounter (Signed)
Pt is scheduled for colon and reported that Suprep is too expensive even after insurance.

## 2019-03-12 NOTE — Telephone Encounter (Signed)
Left message for pt to let me know if she used the Pay No More than $50 Suprep coupon.  If she did not I can send one to her pharmacy.  If she did and it is still too expensive we do not have a sample of suprep right now. Could change preps but she would need to get new instructions.  Asked her to call back and let me know.

## 2019-03-25 ENCOUNTER — Telehealth: Payer: Self-pay | Admitting: *Deleted

## 2019-03-25 NOTE — Telephone Encounter (Signed)
Covid-19 travel screening questions  Have you traveled in the last 14 days? No If yes where?  Do you now or have you had a fever in the last 14 days?No  Do you have any respiratory symptoms of shortness of breath or cough now or in the last 14 days?No  Do you have a medical history of Congestive Heart Failure?  Do you have a medical history of lung disease?  Do you have any family members or close contacts with diagnosed or suspected Covid-19? No   Pt made aware of the new care partner policy and verbalized understanding. SM

## 2019-03-25 NOTE — Telephone Encounter (Signed)
Covid-19 travel screening questions  Have you traveled in the last 14 days? If yes where?  Do you now or have you had a fever in the last 14 days?  Do you have any respiratory symptoms of shortness of breath or cough now or in the last 14 days?  Do you have a medical history of Congestive Heart Failure?  Do you have a medical history of lung disease?  Do you have any family members or close contacts with diagnosed or suspected Covid-19?       

## 2019-03-26 ENCOUNTER — Other Ambulatory Visit: Payer: Self-pay

## 2019-03-26 ENCOUNTER — Encounter: Payer: Self-pay | Admitting: Gastroenterology

## 2019-03-26 ENCOUNTER — Ambulatory Visit (AMBULATORY_SURGERY_CENTER): Payer: Managed Care, Other (non HMO) | Admitting: Gastroenterology

## 2019-03-26 ENCOUNTER — Encounter: Payer: Managed Care, Other (non HMO) | Admitting: Gastroenterology

## 2019-03-26 VITALS — BP 150/87 | HR 57 | Temp 98.5°F | Resp 16 | Ht 62.5 in | Wt 167.0 lb

## 2019-03-26 DIAGNOSIS — K649 Unspecified hemorrhoids: Secondary | ICD-10-CM

## 2019-03-26 DIAGNOSIS — R9389 Abnormal findings on diagnostic imaging of other specified body structures: Secondary | ICD-10-CM | POA: Diagnosis present

## 2019-03-26 MED ORDER — SODIUM CHLORIDE 0.9 % IV SOLN: 500.0000 mL | Freq: Once | INTRAVENOUS | Status: DC

## 2019-03-26 NOTE — Patient Instructions (Signed)
Handout for hemorrhoids given.  YOU HAD AN ENDOSCOPIC PROCEDURE TODAY AT THE [facility]:   Refer to the procedure report that was given to you for any specific questions about what was found during the examination.  If the procedure report does not answer your questions, please call your gastroenterologist to clarify.  If you requested that your care partner not be given the details of your procedure findings, then the procedure report has been included in a sealed envelope for you to review at your convenience later.  YOU SHOULD EXPECT: Some feelings of bloating in the abdomen. Passage of more gas than usual.  Walking can help get rid of the air that was put into your GI tract during the procedure and reduce the bloating. If you had a lower endoscopy (such as a colonoscopy or flexible sigmoidoscopy) you may notice spotting of blood in your stool or on the toilet paper. If you underwent a bowel prep for your procedure, you may not have a normal bowel movement for a few days.  Please Note:  You might notice some irritation and congestion in your nose or some drainage.  This is from the oxygen used during your procedure.  There is no need for concern and it should clear up in a day or so.  SYMPTOMS TO REPORT IMMEDIATELY:   Following lower endoscopy (colonoscopy or flexible sigmoidoscopy):  Excessive amounts of blood in the stool  Significant tenderness or worsening of abdominal pains  Swelling of the abdomen that is new, acute  Fever of 100F or higher   For urgent or emergent issues, a gastroenterologist can be reached at any hour by calling [phone].   DIET:  We do recommend a small meal at first, but then you may proceed to your regular diet.  Drink plenty of fluids but you should avoid alcoholic beverages for 24 hours.  ACTIVITY:  You should plan to take it easy for the rest of today and you should NOT DRIVE or use heavy machinery until tomorrow (because of the sedation  medicines used during the test).    FOLLOW UP: Our staff will call the number listed on your records the next business day following your procedure to check on you and address any questions or concerns that you may have regarding the information given to you following your procedure. If we do not reach you, we will leave a message.  However, if you are feeling well and you are not experiencing any problems, there is no need to return our call.  We will assume that you have returned to your regular daily activities without incident.  If any biopsies were taken you will be contacted by phone or by letter within the next 1-3 weeks.  Please call us at [phone] if you have not heard about the biopsies in 3 weeks.    SIGNATURES/CONFIDENTIALITY: You and/or your care partner have signed paperwork which will be entered into your electronic medical record.  These signatures attest to the fact that that the information above on your After Visit Summary has been reviewed and is understood.  Full responsibility of the confidentiality of this discharge information lies with you and/or your care-partner. 

## 2019-03-26 NOTE — Op Note (Signed)
[facility] Patient Name: [name] Procedure Date: 03/26/2019 10:34 AM MRN: [id number] Endoscopist: Lynann Bologna , MD Age: 43 Referring MD:  Date of Birth: September 16, 1976 Gender: Female Account #: [id number] Procedure:                Colonoscopy Indications:              Abnormal CT of the GI tract showing terminal                            ileitis. Colonoscopy is being performed to rule out                            Crohn's disease. Medicines:                Monitored Anesthesia Care Procedure:                Pre-Anesthesia Assessment:                           - Prior to the procedure, a History and Physical                            was performed, and patient medications and                            allergies were reviewed. The patient's tolerance of                            previous anesthesia was also reviewed. The risks                            and benefits of the procedure and the sedation                            options and risks were discussed with the patient.                            All questions were answered, and informed consent                            was obtained. Prior Anticoagulants: The patient has                            taken no previous anticoagulant or antiplatelet                            agents. ASA Grade Assessment: I - A normal, healthy                            patient. After reviewing the risks and benefits,                            the patient was deemed in satisfactory condition to  undergo the procedure.                           After obtaining informed consent, the colonoscope                            was passed under direct vision. Throughout the                            procedure, the patient's blood pressure, pulse, and                            oxygen saturations were monitored continuously. The                            Colonoscope was introduced through the anus and                       advanced to the 4 cm into the ileum. The                            colonoscopy was performed without difficulty. The                            patient tolerated the procedure well. The quality                            of the bowel preparation was excellent. The                            terminal ileum, ileocecal valve, appendiceal                            orifice, and rectum were photographed. Scope In: 10:39:58 AM Scope Out: 10:52:49 AM Scope Withdrawal Time: 0 hours 10 minutes 36 seconds  Total Procedure Duration: 0 hours 12 minutes 51 seconds  Findings:                 The colon (entire examined portion) appeared                            normal. Biopsies were taken with a cold forceps for                            histology. Estimated blood loss: none.                           The terminal ileum appeared normal. Biopsies were                            taken with a cold forceps for histology. Estimated                            blood loss: none.  Non-bleeding internal hemorrhoids were found during                            retroflexion. The hemorrhoids were small. Minor                            rectal prolapse.                           The exam was otherwise without abnormality on                            direct and retroflexion views. Complications:            No immediate complications. Estimated Blood Loss:     Estimated blood loss: none. Impression:               - Small internal hemorrhoids.                           - Otherwise normal colonoscopy to TI. No endoscopic                            evidence of Crohn's disease. Recommendation:           - Patient has a contact number available for                            emergencies. The signs and symptoms of potential                            delayed complications were discussed with the                            patient. Return to normal activities tomorrow.                             Written discharge instructions were provided to the                            patient.                           - Resume previous diet.                           - Continue present medications.                           - Await pathology results.                           - Return to GI clinic PRN. Lynann Bologna, MD 03/26/2019 11:02:52 AM This report has been signed electronically.

## 2019-03-26 NOTE — Progress Notes (Signed)
Report to PACU, RN, vss, BBS= Clear.  

## 2019-03-26 NOTE — Progress Notes (Signed)
Called to room to assist during endoscopic procedure.  Patient ID and intended procedure confirmed with present staff. Received instructions for my participation in the procedure from the performing physician.  

## 2019-03-27 ENCOUNTER — Telehealth: Payer: Self-pay | Admitting: *Deleted

## 2019-03-27 NOTE — Telephone Encounter (Signed)
  Follow up Call-  Call back number 03/26/2019  Post procedure Call Back phone  # #[phone] cell  Permission to leave phone message Yes  Some recent data might be hidden     Patient questions:  Message left to call us if necessary.

## 2019-03-27 NOTE — Telephone Encounter (Signed)
  Follow up Call-  Call back number 03/26/2019  Post procedure Call Back phone  # #[phone] cell  Permission to leave phone message Yes  Some recent data might be hidden     Patient questions:  Do you have a fever, pain , or abdominal swelling? No. Pain Score  0 *  Have you tolerated food without any problems? Yes.    Have you been able to return to your normal activities? Yes.    Do you have any questions about your discharge instructions: Diet   No. Medications  No. Follow up visit  No.  Do you have questions or concerns about your Care? No.  Actions: * If pain score is 4 or above: No action needed, pain <4.

## 2019-04-03 ENCOUNTER — Encounter: Payer: Self-pay | Admitting: Gastroenterology

## 2019-11-12 ENCOUNTER — Telehealth: Payer: Self-pay | Admitting: Family Medicine

## 2019-11-12 DIAGNOSIS — I10 Essential (primary) hypertension: Secondary | ICD-10-CM

## 2019-11-12 DIAGNOSIS — R7303 Prediabetes: Secondary | ICD-10-CM

## 2019-11-12 NOTE — Telephone Encounter (Signed)
Patient called today and scheduled her CPE for 12/9 She gets her labs done at [place] and will need the orders printed for her to take with her before the appointment

## 2019-11-14 NOTE — Telephone Encounter (Signed)
Orders released and mailed to pt °

## 2019-11-14 NOTE — Addendum Note (Signed)
Addended by: Tammi Sou on: 11/14/2019 10:20 AM   Modules accepted: Orders

## 2019-11-14 NOTE — Addendum Note (Signed)
Addended by: Loura Pardon A on: 11/14/2019 09:18 AM   Modules accepted: Orders

## 2019-11-14 NOTE — Telephone Encounter (Signed)
I ordered them Please print the pages and send to her  Thanks

## 2019-11-28 LAB — CBC WITH DIFFERENTIAL/PLATELET
Basophils Absolute: 0 10*3/uL (ref 0.0–0.2)
Basos: 1 %
EOS (ABSOLUTE): 0.1 10*3/uL (ref 0.0–0.4)
Eos: 2 %
Hematocrit: 38.2 % (ref 34.0–46.6)
Hemoglobin: 13.2 g/dL (ref 11.1–15.9)
Immature Grans (Abs): 0 10*3/uL (ref 0.0–0.1)
Immature Granulocytes: 0 %
Lymphocytes Absolute: 1.3 10*3/uL (ref 0.7–3.1)
Lymphs: 22 %
MCH: 31.2 pg (ref 26.6–33.0)
MCHC: 34.6 g/dL (ref 31.5–35.7)
MCV: 90 fL (ref 79–97)
Monocytes Absolute: 0.5 10*3/uL (ref 0.1–0.9)
Monocytes: 9 %
Neutrophils Absolute: 4 10*3/uL (ref 1.4–7.0)
Neutrophils: 66 %
Platelets: 196 10*3/uL (ref 150–450)
RBC: 4.23 x10E6/uL (ref 3.77–5.28)
RDW: 12.9 % (ref 11.7–15.4)
WBC: 6 10*3/uL (ref 3.4–10.8)

## 2019-11-28 LAB — COMPREHENSIVE METABOLIC PANEL
ALT: 14 IU/L (ref 0–32)
AST: 16 IU/L (ref 0–40)
Albumin/Globulin Ratio: 2 (ref 1.2–2.2)
Albumin: 4.5 g/dL (ref 3.8–4.8)
Alkaline Phosphatase: 77 IU/L (ref 39–117)
BUN/Creatinine Ratio: 11 (ref 9–23)
BUN: 9 mg/dL (ref 6–24)
Bilirubin Total: 0.4 mg/dL (ref 0.0–1.2)
CO2: 23 mmol/L (ref 20–29)
Calcium: 9 mg/dL (ref 8.7–10.2)
Chloride: 105 mmol/L (ref 96–106)
Creatinine, Ser: 0.8 mg/dL (ref 0.57–1.00)
GFR calc Af Amer: 104 mL/min/{1.73_m2} (ref >59)
GFR calc non Af Amer: 91 mL/min/{1.73_m2} (ref >59)
Globulin, Total: 2.3 g/dL (ref 1.5–4.5)
Glucose: 101 mg/dL — ABNORMAL HIGH (ref 65–99)
Potassium: 4.5 mmol/L (ref 3.5–5.2)
Sodium: 141 mmol/L (ref 134–144)
Total Protein: 6.8 g/dL (ref 6.0–8.5)

## 2019-11-28 LAB — LIPID PANEL
Chol/HDL Ratio: 2.1 ratio (ref 0.0–4.4)
Cholesterol, Total: 175 mg/dL (ref 100–199)
HDL: 84 mg/dL (ref >39)
LDL Chol Calc (NIH): 81 mg/dL (ref 0–99)
Triglycerides: 49 mg/dL (ref 0–149)
VLDL Cholesterol Cal: 10 mg/dL (ref 5–40)

## 2019-11-28 LAB — HEMOGLOBIN A1C
Est. average glucose Bld gHb Est-mCnc: 111 mg/dL
Hgb A1c MFr Bld: 5.5 % (ref 4.8–5.6)

## 2019-11-28 LAB — TSH: TSH: 2.78 u[IU]/mL (ref 0.450–4.500)

## 2019-12-03 ENCOUNTER — Ambulatory Visit (INDEPENDENT_AMBULATORY_CARE_PROVIDER_SITE_OTHER): Payer: Managed Care, Other (non HMO) | Admitting: Family Medicine

## 2019-12-03 ENCOUNTER — Other Ambulatory Visit: Payer: Self-pay

## 2019-12-03 ENCOUNTER — Encounter: Payer: Self-pay | Admitting: Family Medicine

## 2019-12-03 VITALS — BP 124/70 | HR 93 | Temp 98.0°F | Ht 63.0 in | Wt 201.5 lb

## 2019-12-03 DIAGNOSIS — R7303 Prediabetes: Secondary | ICD-10-CM

## 2019-12-03 DIAGNOSIS — Z6832 Body mass index (BMI) 32.0-32.9, adult: Secondary | ICD-10-CM | POA: Insufficient documentation

## 2019-12-03 DIAGNOSIS — I10 Essential (primary) hypertension: Secondary | ICD-10-CM

## 2019-12-03 DIAGNOSIS — Z0000 Encounter for general adult medical examination without abnormal findings: Secondary | ICD-10-CM

## 2019-12-03 DIAGNOSIS — E669 Obesity, unspecified: Secondary | ICD-10-CM | POA: Diagnosis not present

## 2019-12-03 MED ORDER — FLUTICASONE PROPIONATE 50 MCG/ACT NA SUSP: 2.0000 | Freq: Every day | NASAL | 3 refills | Status: DC

## 2019-12-03 MED ORDER — METFORMIN HCL ER (MOD) 500 MG PO TB24: 500.0000 mg | ORAL_TABLET | Freq: Every day | ORAL | 3 refills | Status: DC

## 2019-12-03 MED ORDER — LOSARTAN POTASSIUM 50 MG PO TABS: 50.0000 mg | ORAL_TABLET | Freq: Every day | ORAL | 3 refills | Status: DC

## 2019-12-03 NOTE — Assessment & Plan Note (Signed)
Reviewed health habits including diet and exercise and skin cancer prevention Reviewed appropriate screening tests for age  Also reviewed health mt list, fam hx and immunization status , as well as social and family history   See HPI Labs reviewed  Pt declines flu vaccine unfortunately  Sent for last mammogram and pap reports Pt has gyn visit later this mo for annual exam (does std screening there as well)  Has had a partial hysterectomy  Disc plan to get back on track with wt loss (diet/exercie and water intake)

## 2019-12-03 NOTE — Assessment & Plan Note (Signed)
Lab Results  Component Value Date   HGBA1C 5.5 11/27/2019   Pt wishes to get back on metformin ER to help with glucose and wt loss  disc imp of low glycemic diet and wt loss to prevent DM2

## 2019-12-03 NOTE — Patient Instructions (Addendum)
Start exercising at home  Walking is great  Videos work also or exercise equip at home  Also get back to healthy eating   Please ask your gyn to send pap and mammogram reports   Let us know if you want a flu shot   Take care of yourself  Try to get 64 oz of fluid per day (mostly water)

## 2019-12-03 NOTE — Progress Notes (Signed)
Subjective:    Patient ID: [name], female    DOB: 07-01-1976, 43 y.o.   MRN: [id number]  This visit occurred during the SARS-CoV-2 public health emergency.  Safety protocols were in place, including screening questions prior to the visit, additional usage of staff PPE, and extensive cleaning of exam room while observing appropriate contact time as indicated for disinfecting solutions.    HPI Here for health maintenance exam and to review chronic medical problems    Has been feeling pretty good  Overall working a lot -working for [employer]  Still goes into her work place  (people are masked and spread out) Will be transitioned to work at home soon     [employer] from Last 3 Encounters:  12/03/19 201 lb 8 oz (91.4 kg)  03/26/19 167 lb (75.8 kg)  03/10/19 167 lb (75.8 kg)  she was going to bariatric doctor for a while (inside of a gym)-then it closed  35.69 kg/m  Not eating healthy or exercising  Did well until the pandemic started   She wants to get back into exercise at home   Also needs to get back to drinking more water   Has appt on 12/31 -[place] [facility]  Does pap and mammogram there    Flu shot thinks they make her sick    [facility]ammogram 1/20- [facility]  Self breast exam -no lumps   Tdap 12/18  HIV screen neg 12/17 Will do std screen with gyn   bp is stable today  No cp or palpitations or headaches or edema  No side effects to medicines  BP Readings from Last 3 Encounters:  12/03/19 124/70  03/26/19 (!) 150/87  03/10/19 110/70     Pulse Readings from Last 3 Encounters:  12/03/19 93  03/26/19 (!) 57  03/10/19 72   Prediabetes Lab Results  Component Value Date   HGBA1C 5.5 11/27/2019  stable Taking metformin (from bariatric provider)-but then stopped it when there was a recall (to help weight loss)  No low blood glucose issues    Cholesterol Lab Results  Component Value Date   CHOL 175 11/27/2019   CHOL 138 11/20/2018   CHOL 173  11/22/2017   Lab Results  Component Value Date   HDL 84 11/27/2019   HDL 73 11/20/2018   HDL 58 11/22/2017   Lab Results  Component Value Date   LDLCALC 81 11/27/2019   LDLCALC 55 11/20/2018   LDLCALC 97 11/22/2017   Lab Results  Component Value Date   TRIG 49 11/27/2019   TRIG 51 11/20/2018   TRIG 88 11/22/2017   Lab Results  Component Value Date   CHOLHDL 2.1 11/27/2019   CHOLHDL 1.9 11/20/2018   CHOLHDL 3.0 11/22/2017   No results found for: LDLDIRECT No problems   Lab Results  Component Value Date   CREATININE 0.80 11/27/2019   BUN 9 11/27/2019   NA 141 11/27/2019   K 4.5 11/27/2019   CL 105 11/27/2019   CO2 23 11/27/2019   Lab Results  Component Value Date   ALT 14 11/27/2019   AST 16 11/27/2019   ALKPHOS 77 11/27/2019   BILITOT 0.4 11/27/2019    Lab Results  Component Value Date   TSH 2.780 11/27/2019    Lab Results  Component Value Date   WBC 6.0 11/27/2019   HGB 13.2 11/27/2019   HCT 38.2 11/27/2019   MCV 90 11/27/2019   PLT 196 11/27/2019    Patient Active Problem List  Diagnosis Date Noted  . Obesity (BMI 30-39.9) 12/03/2019  . Hematuria, microscopic 03/04/2019  . Colitis 03/02/2019  . Prediabetes 11/27/2017  . Snoring 11/24/2016  . Screening mammogram, encounter for 11/24/2016  . Sleep apnea 01/03/2016  . Routine general medical examination at a health care facility 09/26/2013  . Essential hypertension 09/16/2012  . 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 Allergic rhinitis 07/02/2007  . GERD 07/02/2007   Past Medical History:  Diagnosis Date  . Abnormal Pap smear    cin-1  . Allergic rhinitis    seasonal  . Allergy   . Diabetes mellitus without complication (HCC)    boarderline per pt  . GERD (gastroesophageal reflux disease)   . 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 Hypertension    Past Surgical History:  Procedure Laterality Date  . CERVIX LESION DESTRUCTION    . COLPOSCOPY  1997   abnormal pap   . LAMINECTOMY AND MICRODISCECTOMY LUMBAR SPINE  12/2007   L5-S1  . PARTIAL HYSTERECTOMY  11/2005   bleeding  . WISDOM TOOTH EXTRACTION     Social History   Tobacco Use  . Smoking status: Never Smoker  . Smokeless tobacco: Never Used  Substance Use Topics  . Alcohol use: Yes    Alcohol/week: 0.0 standard drinks    Comment: social  . Drug use: No   Family History  Problem Relation Age of Onset  . Hypertension Mother 40  . Hypertension Father   . Diabetes Paternal Grandfather   . Breast cancer Maternal Grandmother   . Colon cancer Other        M.Great aunt  . Stroke Paternal Grandmother   . Stomach cancer Neg Hx   . Pancreatic cancer Neg Hx   . Esophageal cancer Neg Hx   . Rectal cancer Neg Hx    Allergies  Allergen Reactions  . Lisinopril     Cough   . Morphine     REACTION: skin crawling  . Oxycodone Itching  . Rofecoxib     REACTION: blurred vision, dizziness   Current Outpatient Medications on File Prior to Visit  Medication Sig Dispense Refill  . acetaminophen (TYLENOL) 325 MG tablet Take 650 mg by mouth every 6 (six) hours as needed.    . Cyanocobalamin 1000 MCG/ML LIQD Inject 1 mL as directed every 21 ( twenty-one) days.    . cyclobenzaprine (FLEXERIL) 10 MG tablet Take 1 tablet (10 mg total) by mouth 3 (three) times daily as needed for muscle spasms (caution this may sedate). 30 tablet 1  . ibuprofen (ADVIL,MOTRIN) 200 MG tablet Take 200 mg by mouth every 6 (six) hours as needed.    . Multiple Vitamin (MULTIVITAMIN) capsule Take 1 capsule by mouth daily.    . NON FORMULARY every 21 ( twenty-one) days. Lipotropic injection    . valACYclovir (VALTREX) 1000 MG tablet TAKE 1/2 TABLET  BY MOUTH 2 TIMES DAILY AS NEEDED. 90 tablet 1   No current facility-administered medications on file prior to visit.     Review of Systems  Constitutional: Positive for activity change and appetite change. Negative for fatigue, fever and unexpected weight change.  HENT: Negative  for congestion, ear pain, rhinorrhea, sinus pressure and sore throat.   Eyes: Negative for pain, redness and visual disturbance.  Respiratory: Negative for cough, shortness of breath and wheezing.   Cardiovascular: Negative for chest pain and palpitations.  Gastrointestinal: Negative for abdominal pain, blood in stool, constipation and diarrhea.  Endocrine:  Negative for polydipsia and polyuria.  Genitourinary: Negative for dysuria, frequency and urgency.  Musculoskeletal: Negative for arthralgias, back pain and myalgias.  Skin: Negative for pallor and rash.  Allergic/Immunologic: Negative for environmental allergies.  Neurological: Negative for dizziness, syncope and headaches.  Hematological: Negative for adenopathy. Does not bruise/bleed easily.  Psychiatric/Behavioral: Negative for decreased concentration and dysphoric mood. The patient is not nervous/anxious.        Objective:   Physical Exam Constitutional:      General: She is not in acute distress.    Appearance: Normal appearance. She is well-developed. She is obese. She is not ill-appearing or diaphoretic.  HENT:     Head: Normocephalic and atraumatic.     Right Ear: Tympanic membrane, ear canal and external ear normal.     Left Ear: Tympanic membrane, ear canal and external ear normal.     Nose: Nose normal. No congestion.     Mouth/Throat:     Mouth: Mucous membranes are moist.     Pharynx: Oropharynx is clear. No posterior oropharyngeal erythema.  Eyes:     General: No scleral icterus.    Extraocular Movements: Extraocular movements intact.     Conjunctiva/sclera: Conjunctivae normal.     Pupils: Pupils are equal, round, and reactive to light.  Neck:     Musculoskeletal: Normal range of motion and neck supple. No neck rigidity or muscular tenderness.     Thyroid: No thyromegaly.     Vascular: No carotid bruit or JVD.  Cardiovascular:     Rate and Rhythm: Normal rate and regular rhythm.     Pulses: Normal pulses.      Heart sounds: Normal heart sounds. No gallop.   Pulmonary:     Effort: Pulmonary effort is normal. No respiratory distress.     Breath sounds: Normal breath sounds. No wheezing.     Comments: Good air exch Chest:     Chest wall: No tenderness.  Abdominal:     General: Bowel sounds are normal. There is no distension or abdominal bruit.     Palpations: Abdomen is soft. There is no mass.     Tenderness: There is no abdominal tenderness.     Hernia: No hernia is present.  Genitourinary:    Comments: Breast and pelvic exam done at gyn Musculoskeletal: Normal range of motion.        General: No tenderness.     Right lower leg: No edema.     Left lower leg: No edema.  Lymphadenopathy:     Cervical: No cervical adenopathy.  Skin:    General: Skin is warm and dry.     Coloration: Skin is not pale.     Findings: No erythema or rash.  Neurological:     Mental Status: She is alert. Mental status is at baseline.     Cranial Nerves: No cranial nerve deficit.     Motor: No abnormal muscle tone.     Coordination: Coordination normal.     Gait: Gait normal.     Deep Tendon Reflexes: Reflexes are normal and symmetric. Reflexes normal.  Psychiatric:        Mood and Affect: Mood and affect normal.        Cognition and Memory: Cognition and memory normal.           Assessment & Plan:   Problem List Items Addressed This Visit      Cardiovascular and Mediastinum   Essential hypertension    bp in fair control  at this time  BP Readings from Last 1 Encounters:  12/03/19 124/70   No changes needed Most recent labs reviewed  Disc lifstyle change with low sodium diet and exercise        Relevant Medications   losartan (COZAAR) 50 MG tablet     Other   Routine general medical examination at a health care facility - Primary    Reviewed health habits including diet and exercise and skin cancer prevention Reviewed appropriate screening tests for age  Also reviewed health mt list, fam  hx and immunization status , as well as social and family history   See HPI Labs reviewed  Pt declines flu vaccine unfortunately  Sent for last mammogram and pap reports Pt has gyn visit later this mo for annual exam (does std screening there as well)  Has had a partial hysterectomy  Disc plan to get back on track with wt loss (diet/exercie and water intake)       Prediabetes    Lab Results  Component Value Date   HGBA1C 5.5 11/27/2019   Pt wishes to get back on metformin ER to help with glucose and wt loss  disc imp of low glycemic diet and wt loss to prevent DM2       Obesity (BMI 30-39.9)    Pt gained back lost wt after going to bariatric provider in her gym  Discussed how this problem influences overall health and the risks it imposes  Reviewed plan for weight loss with lower calorie diet (via better food choices and also portion control or program like weight watchers) and exercise building up to or more than 30 minutes 5 days per week including some aerobic activity   Re started metformin today Discussed a plan for exercise at home  Back to diet with less processed carbs and more water      Relevant Medications   metFORMIN (GLUMETZA) 500 MG (MOD) 24 hr tablet

## 2019-12-03 NOTE — Assessment & Plan Note (Signed)
Pt gained back lost wt after going to bariatric provider in her gym  Discussed how this problem influences overall health and the risks it imposes  Reviewed plan for weight loss with lower calorie diet (via better food choices and also portion control or program like weight watchers) and exercise building up to or more than 30 minutes 5 days per week including some aerobic activity   Re started metformin today Discussed a plan for exercise at home  Back to diet with less processed carbs and more water

## 2019-12-03 NOTE — Assessment & Plan Note (Signed)
bp in fair control at this time  BP Readings from Last 1 Encounters:  12/03/19 124/70   No changes needed Most recent labs reviewed  Disc lifstyle change with low sodium diet and exercise

## 2019-12-04 ENCOUNTER — Telehealth: Payer: Self-pay | Admitting: *Deleted

## 2019-12-04 MED ORDER — METFORMIN HCL ER 500 MG PO TB24: 500.0000 mg | ORAL_TABLET | Freq: Every day | ORAL | 3 refills | Status: DC

## 2019-12-04 NOTE — Telephone Encounter (Signed)
Received fax from [facility] saying:  "Metformin MOD 500mg  is NOT COVERED under your patient's insurance coverage. We are requesting approval to dispense a covered alternative."  The letter also states that the covered alt med is: Metformin ER: its a Tier 1,  They requested that if approved to send in alt med to also in the pharmacy notes put the order # so they know which med to replace. The order # is: Order # [id number]

## 2019-12-04 NOTE — Telephone Encounter (Signed)
I sent it Please let pt know  It should be essentially the same but pill may look different  Thanks

## 2019-12-10 ENCOUNTER — Encounter: Payer: Self-pay | Admitting: Family Medicine

## 2019-12-10 ENCOUNTER — Ambulatory Visit (INDEPENDENT_AMBULATORY_CARE_PROVIDER_SITE_OTHER): Payer: Managed Care, Other (non HMO) | Admitting: Family Medicine

## 2019-12-10 DIAGNOSIS — U071 COVID-19: Secondary | ICD-10-CM | POA: Insufficient documentation

## 2019-12-10 NOTE — Patient Instructions (Signed)
Isolate within your home (masks/separate room), for 14 days past positive test result or 3 days after last fever /symptoms resolve Drink fluids and rest  Tylenol or ibuprofen helps headache or pain or fever Continue flonase for congestion  Breathe steam  Alert [place] if symptoms worsen or change (seek care if short of breath/ high fever)

## 2019-12-10 NOTE — Progress Notes (Signed)
Virtual Visit via Video Note  I connected with [name] on 12/10/19 at  4:15 PM EST by a video enabled telemedicine application and verified that I am speaking with the correct person using two identifiers.  Location: Patient: home Provider: office    I discussed the limitations of evaluation and management by telemedicine and the availability of in person appointments. The patient expressed understanding and agreed to proceed.  Parties involved in encounter  Patient: [name]   Provider:  Roxy MannsMarne Raynette Arras MD   A doxy[employer]me server error prevented video connection today so the visit was conducted by phone   History of Present Illness: Pt presents with a positive covid test   Sunday found out some people she had been around had tested pos for covid (from a funeral viewing last week)  She tested positive on Monday   No symptoms 2 d ago when tested except headache (for 2 d prior) Thought she had a migraine on Saturday  Slight nasal congestion /sinus pressure A little post nasal drainage  Quite mild so far   Tested Monday 14th of December  Went to [facility] in [place] ([address])  Exposure was the prior Tuesday evening   Cough- occ and dry/ makes her throat feel scratchy   No fever or chills or aches No loss of taste or smell   Her 2 sons live with her-everyone is staying separate She sequesters herself to bedroom and wears a mask  They will be tested tomorrow   She is taking elderberry/zinc and tea  Fluids and rest   Continues to work from home -no problems with that   Patient Active Problem List   Diagnosis Date Noted  . COVID-19 12/10/2019  . Obesity (BMI 30-39.9) 12/03/2019  . Hematuria, microscopic 03/04/2019  . Colitis 03/02/2019  . Prediabetes 11/27/2017  . Snoring 11/24/2016  . Screening mammogram, encounter for 11/24/2016  . Sleep apnea 01/03/2016  . Routine general medical examination at a health care facility 09/26/2013  .  Essential hypertension 09/16/2012  . 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 Allergic rhinitis 07/02/2007  . GERD 07/02/2007   Past Medical History:  Diagnosis Date  . Abnormal Pap smear    cin-1  . Allergic rhinitis    seasonal  . Allergy   . Diabetes mellitus without complication (HCC)    boarderline per pt  . GERD (gastroesophageal reflux disease)   . 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 Hypertension    Past Surgical History:  Procedure Laterality Date  . CERVIX LESION DESTRUCTION    . COLPOSCOPY  1997   abnormal pap  . LAMINECTOMY AND MICRODISCECTOMY LUMBAR SPINE  12/2007   L5-S1  . PARTIAL HYSTERECTOMY  11/2005   bleeding  . WISDOM TOOTH EXTRACTION     Social History   Tobacco Use  . Smoking status: Never Smoker  . Smokeless tobacco: Never Used  Substance Use Topics  . Alcohol use: Yes    Alcohol/week: 0.0 standard drinks    Comment: social  . Drug use: No   Family History  Problem Relation Age of Onset  . Hypertension Mother [id number]  . Hypertension Father   . Diabetes Paternal Grandfather   . Breast cancer Maternal Grandmother   . Colon cancer Other        M.Great aunt  . Stroke Paternal Grandmother   . Stomach cancer Neg Hx   . Pancreatic cancer Neg Hx   . Esophageal cancer Neg Hx   .  Rectal cancer Neg Hx    Allergies  Allergen Reactions  . Lisinopril     Cough   . Morphine     REACTION: skin crawling  . Oxycodone Itching  . Rofecoxib     REACTION: blurred vision, dizziness   Current Outpatient Medications on File Prior to Visit  Medication Sig Dispense Refill  . acetaminophen (TYLENOL) 325 MG tablet Take 650 mg by mouth every 6 (six) hours as needed.    . cyclobenzaprine (FLEXERIL) 10 MG tablet Take 1 tablet (10 mg total) by mouth 3 (three) times daily as needed for muscle spasms (caution this may sedate). 30 tablet 1  . fluticasone (FLONASE) 50 MCG/ACT nasal spray Place 2 sprays into both nostrils daily. 48 g 3  . ibuprofen  (ADVIL,MOTRIN) 200 MG tablet Take 200 mg by mouth every 6 (six) hours as needed.    Marland Kitchen losartan (COZAAR) 50 MG tablet Take 1 tablet (50 mg total) by mouth daily. 90 tablet 3  . metFORMIN (GLUCOPHAGE-XR) 500 MG 24 hr tablet Take 1 tablet (500 mg total) by mouth daily with breakfast. 90 tablet 3  . valACYclovir (VALTREX) 1000 MG tablet TAKE 1/2 TABLET  BY MOUTH 2 TIMES DAILY AS NEEDED. 90 tablet 1  . Cyanocobalamin 1000 MCG/ML LIQD Inject 1 mL as directed every 21 ( twenty-one) days.    . Multiple Vitamin (MULTIVITAMIN) capsule Take 1 capsule by mouth daily.     No current facility-administered medications on file prior to visit.     Review of Systems  Constitutional: Positive for malaise/fatigue. Negative for chills and fever.  HENT: Positive for congestion, sinus pain and sore throat. Negative for ear discharge and ear pain.   Eyes: Negative for blurred vision, discharge and redness.  Respiratory: Positive for cough. Negative for sputum production, shortness of breath, wheezing and stridor.        Scant cough if any  Cardiovascular: Negative for chest pain, palpitations and leg swelling.  Gastrointestinal: Negative for abdominal pain, diarrhea, nausea and vomiting.  Musculoskeletal: Negative for myalgias.  Skin: Negative for rash.  Neurological: Positive for headaches. Negative for dizziness.    Observations/Objective: Patient sounds well and in no distress (like her normal self) Not hoarse or congested sounding No cough or sob during interview  Nl cognition- good historian  Nl mood and affect    Assessment and Plan: Problem List Items Addressed This Visit      Other   COVID-19    Pt tested positive on Monday 12/08/19  Very mild symptoms- post nasal drip/sinus congestion and occ cough (headache initially)  She suspects that exposure was 6 d prior at a funeral  Discussed symptomatic care Will continue flonase ns Tylenol or ibuprofen for pain/fever  inst to watch temp and  continue to update Disc symptoms to watch for including worse cough/sob/dizziness/fever/ changes in taste/smell She will continue to isolate 14 days from positive test date or longer if symptoms continue             Follow Up Instructions: Isolate within your home (masks/separate room), for 14 days past positive test result or 3 days after last fever /symptoms resolve Drink fluids and rest  Tylenol or ibuprofen helps headache or pain or fever Continue flonase for congestion  Breathe steam  Alert [place] if symptoms worsen or change (seek care if short of breath/ high fever)     I discussed the assessment and treatment plan with the patient. The patient was provided an opportunity to ask  questions and all were answered. The patient agreed with the plan and demonstrated an understanding of the instructions.   The patient was advised to call back or seek an in-person evaluation if the symptoms worsen or if the condition fails to improve as anticipated.   Loura Pardon, MD

## 2019-12-10 NOTE — Assessment & Plan Note (Signed)
Pt tested positive on Monday 12/08/19  Very mild symptoms- post nasal drip/sinus congestion and occ cough (headache initially)  She suspects that exposure was 6 d prior at a funeral  Discussed symptomatic care Will continue flonase ns Tylenol or ibuprofen for pain/fever  inst to watch temp and continue to update Disc symptoms to watch for including worse cough/sob/dizziness/fever/ changes in taste/smell She will continue to isolate 14 days from positive test date or longer if symptoms continue

## 2019-12-12 ENCOUNTER — Telehealth: Payer: Self-pay

## 2019-12-12 NOTE — Telephone Encounter (Signed)
Mucinex or robitussin are fine to use as needed -that is up to her and how bad her symptoms are.   Let us know if symptoms worsen or change.  Continue to drink fluids and rest

## 2019-12-12 NOTE — Telephone Encounter (Signed)
Spoke with patient. Feels more stuffy like, congested in the head-like head cold/sinus issues. She does not feel congested all the time, it is off and on. Hot shower helps with congestion feeling. No more throbbing/achy sensation in her throat like she had. Some headache present for which she has been taking Tylenol but this is not helping sinus pressure so she started taking Ibuprofen. No fever. No SOB. Some cough when she feels congested. She overall feels a little worse since 12/10/2019. She has not taking Mucinex or Robitussin for congestion since she has it off and on and not constant but wonders if she should go ahead and get this to take.

## 2019-12-12 NOTE — Telephone Encounter (Signed)
Patient advised.

## 2020-02-05 ENCOUNTER — Other Ambulatory Visit: Payer: Self-pay

## 2020-02-05 ENCOUNTER — Ambulatory Visit (INDEPENDENT_AMBULATORY_CARE_PROVIDER_SITE_OTHER): Payer: Managed Care, Other (non HMO) | Admitting: Family Medicine

## 2020-02-05 ENCOUNTER — Encounter: Payer: Self-pay | Admitting: Family Medicine

## 2020-02-05 VITALS — BP 126/82 | HR 85 | Temp 97.3°F | Ht 63.0 in | Wt 204.1 lb

## 2020-02-05 DIAGNOSIS — M545 Low back pain, unspecified: Secondary | ICD-10-CM | POA: Insufficient documentation

## 2020-02-05 DIAGNOSIS — M5441 Lumbago with sciatica, right side: Secondary | ICD-10-CM

## 2020-02-05 DIAGNOSIS — M7631 Iliotibial band syndrome, right leg: Secondary | ICD-10-CM | POA: Insufficient documentation

## 2020-02-05 MED ORDER — TIZANIDINE HCL 4 MG PO CAPS: 4.0000 mg | ORAL_CAPSULE | Freq: Three times a day (TID) | ORAL | 1 refills | Status: DC | PRN

## 2020-02-05 MED ORDER — MELOXICAM 15 MG PO TABS: 15.0000 mg | ORAL_TABLET | Freq: Every day | ORAL | 1 refills | Status: DC

## 2020-02-05 NOTE — Assessment & Plan Note (Signed)
With R sided sciatica and hx of disc dz with surgery in the past  No neurologic deficits today  Suspect spasm is causing some pain  meloxicam 15 mg daily prn with food (would consider prednisone if not helpful)  Also tizanidine for spasm with caution of sedation Disc use of heat/ice intermittent Update if not starting to improve in a week or if worsening   Would consider PT and imaging if no improvement

## 2020-02-05 NOTE — Progress Notes (Signed)
Subjective:    Patient ID: [name], female    DOB: Sep 29, 1976, 44 y.o.   MRN: [id number]  This visit occurred during the SARS-CoV-2 public health emergency.  Safety protocols were in place, including screening questions prior to the visit, additional usage of staff PPE, and extensive cleaning of exam room while observing appropriate contact time as indicated for disinfecting solutions.    HPI Pt presents with ? Sciatic pain   Wt Readings from Last 3 Encounters:  02/05/20 204 lb 1 oz (92.6 kg)  12/03/19 201 lb 8 oz (91.4 kg)  03/26/19 167 lb (75.8 kg)   36.15 kg/m   Back pain since January  Gradually worsened Tried to continue working out   [employer] ibuprofen 800 mg -now not helping much Worse last night - on the couch-really aching Tried to lie down and could not get comfortable   Tried pillow between and under knees   R side- lateral leg -feels like a spasm   (that has been irritable area in the past)  Hot shower- not helping  Has not tried ice   No numbness or weakness   Has always had soreness in R lateral leg with workouts -does not know thy   Bending forward/down is worse Even trying to put on her socks  Squatting helps   Has not tried to bend backwards   Pain is more sharp than dull   Hx of laminectomy and microdiscectomy of the lumbar spine in 09  (herniated disc) Pain was on the R side with that also   Patient Active Problem List   Diagnosis Date Noted  . Low back pain 02/05/2020  . COVID-19 12/10/2019  . Obesity (BMI 30-39.9) 12/03/2019  . Hematuria, microscopic 03/04/2019  . Colitis 03/02/2019  . Prediabetes 11/27/2017  . Snoring 11/24/2016  . Screening mammogram, encounter for 11/24/2016  . Sleep apnea 01/03/2016  . Routine general medical examination at a health care facility 09/26/2013  . Essential hypertension 09/16/2012  . 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 Allergic rhinitis 07/02/2007  . GERD 07/02/2007   Past Medical  History:  Diagnosis Date  . Abnormal Pap smear    cin-1  . Allergic rhinitis    seasonal  . Allergy   . Diabetes mellitus without complication (HCC)    boarderline per pt  . GERD (gastroesophageal reflux disease)   . 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 Hypertension    Past Surgical History:  Procedure Laterality Date  . CERVIX LESION DESTRUCTION    . COLPOSCOPY  1997   abnormal pap  . LAMINECTOMY AND MICRODISCECTOMY LUMBAR SPINE  12/2007   L5-S1  . PARTIAL HYSTERECTOMY  11/2005   bleeding  . WISDOM TOOTH EXTRACTION     Social History   Tobacco Use  . Smoking status: Never Smoker  . Smokeless tobacco: Never Used  Substance Use Topics  . Alcohol use: Yes    Alcohol/week: 0.0 standard drinks    Comment: social  . Drug use: No   Family History  Problem Relation Age of Onset  . Hypertension Mother 48  . Hypertension Father   . Diabetes Paternal Grandfather   . Breast cancer Maternal Grandmother   . Colon cancer Other        M.Great aunt  . Stroke Paternal Grandmother   . Stomach cancer Neg Hx   . Pancreatic cancer Neg Hx   . Esophageal cancer Neg Hx   . Rectal cancer Neg  Hx    Allergies  Allergen Reactions  . Lisinopril     Cough   . Morphine     REACTION: skin crawling  . Oxycodone Itching  . Rofecoxib     REACTION: blurred vision, dizziness   Current Outpatient Medications on File Prior to Visit  Medication Sig Dispense Refill  . acetaminophen (TYLENOL) 325 MG tablet Take 650 mg by mouth every 6 (six) hours as needed.    . Cyanocobalamin 1000 MCG/ML LIQD Inject 1 mL as directed every 21 ( twenty-one) days.    . fluticasone (FLONASE) 50 MCG/ACT nasal spray Place 2 sprays into both nostrils daily. 48 g 3  . losartan (COZAAR) 50 MG tablet Take 1 tablet (50 mg total) by mouth daily. 90 tablet 3  . metFORMIN (GLUCOPHAGE-XR) 500 MG 24 hr tablet Take 1 tablet (500 mg total) by mouth daily with breakfast. 90 tablet 3  . Multiple Vitamin  (MULTIVITAMIN) capsule Take 1 capsule by mouth daily.    . valACYclovir (VALTREX) 1000 MG tablet TAKE 1/2 TABLET  BY MOUTH 2 TIMES DAILY AS NEEDED. 90 tablet 1   No current facility-administered medications on file prior to visit.    Review of Systems  Constitutional: Negative for activity change, appetite change, fatigue, fever and unexpected weight change.  HENT: Negative for congestion, ear pain, rhinorrhea, sinus pressure and sore throat.   Eyes: Negative for pain, redness and visual disturbance.  Respiratory: Negative for cough, shortness of breath and wheezing.   Cardiovascular: Negative for chest pain and palpitations.  Gastrointestinal: Negative for abdominal pain, blood in stool, constipation and diarrhea.  Endocrine: Negative for polydipsia and polyuria.  Genitourinary: Negative for dysuria, frequency and urgency.  Musculoskeletal: Positive for back pain. Negative for arthralgias, joint swelling and myalgias.  Skin: Negative for pallor and rash.  Allergic/Immunologic: Negative for environmental allergies.  Neurological: Negative for dizziness, syncope, weakness, numbness and headaches.  Hematological: Negative for adenopathy. Does not bruise/bleed easily.  Psychiatric/Behavioral: Negative for decreased concentration and dysphoric mood. The patient is not nervous/anxious.        Objective:   Physical Exam Constitutional:      General: She is not in acute distress.    Appearance: Normal appearance. She is well-developed. She is obese. She is not ill-appearing.  HENT:     Head: Normocephalic and atraumatic.  Eyes:     General: No scleral icterus.    Conjunctiva/sclera: Conjunctivae normal.     Pupils: Pupils are equal, round, and reactive to light.  Cardiovascular:     Rate and Rhythm: Normal rate and regular rhythm.  Pulmonary:     Effort: Pulmonary effort is normal.     Breath sounds: Normal breath sounds. No wheezing or rales.  Abdominal:     General: Bowel sounds  are normal. There is no distension.     Palpations: Abdomen is soft.     Tenderness: There is no abdominal tenderness.  Musculoskeletal:        General: Tenderness present.     Cervical back: Normal range of motion and neck supple.     Lumbar back: Spasms and tenderness present. No edema or bony tenderness. Decreased range of motion.     Comments: Tender over sacrum/coccyx as well as R lumbar musculature and piriformis area  Pain with lumbar flexion 20 deg and L lateral bend Nl gait  SLR causes R buttock pain  No focal neuro symptoms  Tight IT band limits hip rom on the R  Lymphadenopathy:  Cervical: No cervical adenopathy.  Skin:    General: Skin is warm and dry.     Coloration: Skin is not pale.     Findings: No erythema or rash.  Neurological:     Mental Status: She is alert.     Cranial Nerves: No cranial nerve deficit.     Sensory: No sensory deficit.     Motor: No atrophy or abnormal muscle tone.     Coordination: Coordination normal.     Deep Tendon Reflexes: Reflexes are normal and symmetric. Reflexes normal.  Psychiatric:        Mood and Affect: Mood normal.           Assessment & Plan:   Problem List Items Addressed This Visit      Musculoskeletal and Integument   It band syndrome, right    Suspect this is also worsening lateral R thigh pain  meloxicam px  Also handout for IT stretches and rehab        Other   Low back pain - Primary    With R sided sciatica and hx of disc dz with surgery in the past  No neurologic deficits today  Suspect spasm is causing some pain  meloxicam 15 mg daily prn with food (would consider prednisone if not helpful)  Also tizanidine for spasm with caution of sedation Disc use of heat/ice intermittent Update if not starting to improve in a week or if worsening   Would consider PT and imaging if no improvement       Relevant Medications   tiZANidine (ZANAFLEX) 4 MG capsule   meloxicam (MOBIC) 15 MG tablet

## 2020-02-05 NOTE — Assessment & Plan Note (Signed)
Suspect this is also worsening lateral R thigh pain  meloxicam px  Also handout for IT stretches and rehab

## 2020-02-05 NOTE — Patient Instructions (Addendum)
Slow walking is the best thing to do  Use heat/ice on and off (10-15 minutes at a time)   When able- look at the sciatic rehab handout (stretches/exercises)   Take meloxicam as needed with food  Tizanidine as needed with caution of sedation   Look at the IT band material once this is better   If not improving in the next week we should get you to PT- just call  Is suddenly worse let us know

## 2020-02-06 ENCOUNTER — Telehealth: Payer: Self-pay

## 2020-02-06 DIAGNOSIS — M5441 Lumbago with sciatica, right side: Secondary | ICD-10-CM

## 2020-02-06 MED ORDER — CYCLOBENZAPRINE HCL 10 MG PO TABS: 10.0000 mg | ORAL_TABLET | Freq: Three times a day (TID) | ORAL | 1 refills | Status: DC | PRN

## 2020-02-06 MED ORDER — PREDNISONE 10 MG PO TABS: ORAL_TABLET | ORAL | 0 refills | Status: DC

## 2020-02-06 NOTE — Telephone Encounter (Signed)
Urgent Referral sent to [facility] , patient aware.

## 2020-02-06 NOTE — Telephone Encounter (Signed)
I sent them  The prednisone may make her feel hyper and hungry  Flexeril may sedate  Stop the other medications  I want to refer her to orthopedics-is that ok?

## 2020-02-06 NOTE — Telephone Encounter (Signed)
Pt notified of Dr. Royden Purl comments. Pt does want to try prednisone and switch to a different muscle relaxer [facility].  When asked pt about PT she wanted me to ask Dr. Milinda Antis some questions. Pt said that she has a history of back issues and back surgery and given her recent fall does Dr. Milinda Antis think she needs to have her back checked out before going straight to PT, pt doesn't want to do more damage if she injured something from her surgery. Pt said last time they did an MRI of her back she question if that's something she needs to have done again. Pt also asked given pain and back injury does Dr. Milinda Antis think she needs to go to a chiropractor or have acupuncture done to see if that helped or have someone recheck her back out. Pt just is afraid to do PT without any type of f/u on back injury given history of back surgery

## 2020-02-06 NOTE — Telephone Encounter (Signed)
Patient is returning your call.  

## 2020-02-06 NOTE — Telephone Encounter (Signed)
Referral done Will route to [facility]  

## 2020-02-06 NOTE — Telephone Encounter (Signed)
Please see original message about pt falling, she said she fell in the shower several weeks ago, she did have a softball size bruise on back for a while and she just remembered and didn't mention it at appt   Pt also said that since taking the muscle relaxer her leg feels like it's "twitching or jumping" pt said the feeling has worsened since starting the muscle relaxer. Also the pain med and muscle relaxer are not helping much. Pt said it's taking the "edge" off of her pain just enough to walk or function but it's really not helping much and it's only making her sleepy and causing muscle twitches. Pt did know if DR. Tower could prescribe something else or what else should she do. She feels like once she takes the meds meloxicam and zanaflex they help some but they both wear off really quickly and the meloxicam is only once daily and the zanaflex only helps for a hr or so. Pt is still in so much pain she had to call out of work today also. Please advise

## 2020-02-06 NOTE — Telephone Encounter (Signed)
[facility] Night - Client Nonclinical Telephone Record AccessNurse Client [facility] Night - Client Client Site [facility] - Night Physician Roxy Manns - MD Contact Type Call Who Is Calling Patient / Member / Family / Caregiver Caller Name Ivelise Castillo Caller Phone Number [phone] Patient Name [name] Patient DOB 1976/06/20 Call Type Message Only Information Provided Reason for Call Request for General Office Information Initial Comment Caller states they have been having sciatic nerve pain for the last few weeks. States they did fall out of the shower a couple weeks ago and they wanted to update the MD on that. States they also have questions about the medications they were prescribed yesterday. Additional Comment Declined triage. Office hours provided. Disp. Time Disposition Final User 02/06/2020 8:10:27 AM General Information Provided Yes Donald Siva Call Closed By: Donald Siva Transaction Date/Time: 02/06/2020 8:07:53 AM (ET)

## 2020-02-06 NOTE — Telephone Encounter (Signed)
Called pt to see what questions she had regarding her meds (as stated in prev message), no answer so left VM requesting pt to call the office back

## 2020-02-06 NOTE — Telephone Encounter (Signed)
Pt notified Rx sent to pharmacy. Pt notified of Dr. Royden Purl comments and recommendations. Pt agrees with Ortho referral, please put referral in and I advise pt our [facility] will call to schedule appt

## 2020-02-06 NOTE — Telephone Encounter (Signed)
We discussed using prednisone if she got worse (instead of meloxicam) is she ok for that ?   I can also switch to a different muscle relaxer  I want to place a PT referral as well if she is ok with that  Let me know

## 2020-02-09 ENCOUNTER — Telehealth: Payer: Self-pay | Admitting: Family Medicine

## 2020-02-09 NOTE — Telephone Encounter (Signed)
Pt called to request a letter for work because she was out Thursday and Friday due to the problems with her sciatic nerve.

## 2020-02-10 NOTE — Telephone Encounter (Signed)
Pt notified letter ready for pick up. I did advise pt we can't fax work note to job due to needing a release to do so. Pt will get a copy of letter off of her mychart but if there is any issues she will call so we can have the paper note ready for pick up

## 2020-02-10 NOTE — Telephone Encounter (Signed)
Done and in IN box 

## 2020-11-16 ENCOUNTER — Telehealth: Payer: Self-pay | Admitting: Family Medicine

## 2020-11-16 DIAGNOSIS — Z0000 Encounter for general adult medical examination without abnormal findings: Secondary | ICD-10-CM

## 2020-11-16 DIAGNOSIS — I10 Essential (primary) hypertension: Secondary | ICD-10-CM

## 2020-11-16 DIAGNOSIS — R7303 Prediabetes: Secondary | ICD-10-CM

## 2020-11-16 NOTE — Telephone Encounter (Signed)
CPE scheduled 12/01/20

## 2020-11-16 NOTE — Telephone Encounter (Signed)
Pt called in set up her Physical and she needs her lab orders to take to [facility]

## 2020-11-16 NOTE — Telephone Encounter (Signed)
I ordered labs in epic for [facility] lab draw Please print order for her to take to lab

## 2020-11-17 NOTE — Telephone Encounter (Signed)
Orders released and copy placed at the front for pt to pick up

## 2020-11-27 LAB — LIPID PANEL
Chol/HDL Ratio: 2.7 ratio (ref 0.0–4.4)
Cholesterol, Total: 177 mg/dL (ref 100–199)
HDL: 65 mg/dL (ref >39)
LDL Chol Calc (NIH): 96 mg/dL (ref 0–99)
Triglycerides: 87 mg/dL (ref 0–149)
VLDL Cholesterol Cal: 16 mg/dL (ref 5–40)

## 2020-11-27 LAB — CBC WITH DIFFERENTIAL/PLATELET
Basophils Absolute: 0 10*3/uL (ref 0.0–0.2)
Basos: 1 %
EOS (ABSOLUTE): 0.1 10*3/uL (ref 0.0–0.4)
Eos: 1 %
Hematocrit: 39.5 % (ref 34.0–46.6)
Hemoglobin: 13.4 g/dL (ref 11.1–15.9)
Immature Grans (Abs): 0 10*3/uL (ref 0.0–0.1)
Immature Granulocytes: 0 %
Lymphocytes Absolute: 2.2 10*3/uL (ref 0.7–3.1)
Lymphs: 33 %
MCH: 30.7 pg (ref 26.6–33.0)
MCHC: 33.9 g/dL (ref 31.5–35.7)
MCV: 90 fL (ref 79–97)
Monocytes Absolute: 0.5 10*3/uL (ref 0.1–0.9)
Monocytes: 7 %
Neutrophils Absolute: 3.9 10*3/uL (ref 1.4–7.0)
Neutrophils: 58 %
Platelets: 221 10*3/uL (ref 150–450)
RBC: 4.37 x10E6/uL (ref 3.77–5.28)
RDW: 13 % (ref 11.7–15.4)
WBC: 6.7 10*3/uL (ref 3.4–10.8)

## 2020-11-27 LAB — COMPREHENSIVE METABOLIC PANEL
ALT: 22 IU/L (ref 0–32)
AST: 20 IU/L (ref 0–40)
Albumin/Globulin Ratio: 1.8 (ref 1.2–2.2)
Albumin: 4.4 g/dL (ref 3.8–4.8)
Alkaline Phosphatase: 78 IU/L (ref 44–121)
BUN/Creatinine Ratio: 10 (ref 9–23)
BUN: 8 mg/dL (ref 6–24)
Bilirubin Total: 0.7 mg/dL (ref 0.0–1.2)
CO2: 21 mmol/L (ref 20–29)
Calcium: 9.1 mg/dL (ref 8.7–10.2)
Chloride: 104 mmol/L (ref 96–106)
Creatinine, Ser: 0.81 mg/dL (ref 0.57–1.00)
GFR calc Af Amer: 102 mL/min/{1.73_m2} (ref >59)
GFR calc non Af Amer: 89 mL/min/{1.73_m2} (ref >59)
Globulin, Total: 2.4 g/dL (ref 1.5–4.5)
Glucose: 105 mg/dL — ABNORMAL HIGH (ref 65–99)
Potassium: 4.2 mmol/L (ref 3.5–5.2)
Sodium: 139 mmol/L (ref 134–144)
Total Protein: 6.8 g/dL (ref 6.0–8.5)

## 2020-11-27 LAB — TSH: TSH: 3.16 u[IU]/mL (ref 0.450–4.500)

## 2020-11-27 LAB — HEMOGLOBIN A1C
Est. average glucose Bld gHb Est-mCnc: 120 mg/dL
Hgb A1c MFr Bld: 5.8 % — ABNORMAL HIGH (ref 4.8–5.6)

## 2020-11-29 ENCOUNTER — Other Ambulatory Visit: Payer: Self-pay | Admitting: Family Medicine

## 2020-12-01 ENCOUNTER — Other Ambulatory Visit: Payer: Self-pay

## 2020-12-01 ENCOUNTER — Ambulatory Visit (INDEPENDENT_AMBULATORY_CARE_PROVIDER_SITE_OTHER): Payer: Managed Care, Other (non HMO) | Admitting: Family Medicine

## 2020-12-01 ENCOUNTER — Encounter: Payer: Self-pay | Admitting: Family Medicine

## 2020-12-01 VITALS — BP 138/80 | HR 98 | Temp 97.0°F | Ht 63.0 in | Wt 204.2 lb

## 2020-12-01 DIAGNOSIS — E669 Obesity, unspecified: Secondary | ICD-10-CM

## 2020-12-01 DIAGNOSIS — R7303 Prediabetes: Secondary | ICD-10-CM | POA: Diagnosis not present

## 2020-12-01 DIAGNOSIS — Z0001 Encounter for general adult medical examination with abnormal findings: Secondary | ICD-10-CM

## 2020-12-01 DIAGNOSIS — B353 Tinea pedis: Secondary | ICD-10-CM | POA: Insufficient documentation

## 2020-12-01 DIAGNOSIS — I10 Essential (primary) hypertension: Secondary | ICD-10-CM | POA: Diagnosis not present

## 2020-12-01 MED ORDER — KETOCONAZOLE 2 % EX CREA: 1.0000 "application " | TOPICAL_CREAM | Freq: Two times a day (BID) | CUTANEOUS | 3 refills | Status: DC

## 2020-12-01 MED ORDER — METFORMIN HCL ER 500 MG PO TB24
500.0000 mg | ORAL_TABLET | Freq: Every day | ORAL | 3 refills | Status: DC
Start: 2020-12-01 — End: 2021-12-02

## 2020-12-01 NOTE — Progress Notes (Signed)
Subjective:    Patient ID: [name], female    DOB: 1976/03/02, 44 y.o.   MRN: [id number]  This visit occurred during the SARS-CoV-2 public health emergency.  Safety protocols were in place, including screening questions prior to the visit, additional usage of staff PPE, and extensive cleaning of exam room while observing appropriate contact time as indicated for disinfecting solutions.    HPI Here for health maintenance exam and to review chronic medical problems    Wt Readings from Last 3 Encounters:  12/01/20 204 lb 4 oz (92.6 kg)  02/05/20 204 lb 1 oz (92.6 kg)  12/03/19 201 lb 8 oz (91.4 kg)   36.18 kg/m  Recovery from back surgery-going well  Some spasms but pain is much better  Slowly starting to work out also / much care with lifting  Likes the elliptical machine    covid status -tested positive a year ago  Not vaccinated  Works at [employer] and does not have to do routine testing  Stays socially distanced  Has not had it again since then     Flu shot -declines  Tdap 12/18   Mammogram -thinks she had 01/08/20 and gyn visit  Self breast exam -no lumps  Has dense breasts - so cut her coffee   Pap 9/15 at gyn  Thinks she had one 01/08/20  Partial hysterectomy in the past   Pt had early colonoscopy 4/20 for colitis- it was normal    HTN  bp is stable today  No cp or palpitations or headaches or edema  No side effects to medicines   BP Readings from Last 3 Encounters:  12/01/20 138/80  02/05/20 126/82  12/03/19 124/70       First reading was up - drank coffee and has not taken losartan today   Takes losartan 50 mg daily  Pulse Readings from Last 3 Encounters:  12/01/20 98  02/05/20 85  12/03/19 93   Prediabetes Lab Results  Component Value Date   HGBA1C 5.8 (H) 11/26/2020  this is up a bit from 5.5 Taking metformin xr 500 mg bid -she had to back off of during a detox diet (diarrhea)  Planned to get back on it  Glucose was  105   Cholesterol  Lab Results  Component Value Date   CHOL 177 11/26/2020   CHOL 175 11/27/2019   CHOL 138 11/20/2018   Lab Results  Component Value Date   HDL 65 11/26/2020   HDL 84 11/27/2019   HDL 73 11/20/2018   Lab Results  Component Value Date   LDLCALC 96 11/26/2020   LDLCALC 81 11/27/2019   LDLCALC 55 11/20/2018   Lab Results  Component Value Date   TRIG 87 11/26/2020   TRIG 49 11/27/2019   TRIG 51 11/20/2018   Lab Results  Component Value Date   CHOLHDL 2.7 11/26/2020   CHOLHDL 2.1 11/27/2019   CHOLHDL 1.9 11/20/2018   No results found for: LDLDIRECT No beef or pork or shellfish   Lab Results  Component Value Date   WBC 6.7 11/26/2020   HGB 13.4 11/26/2020   HCT 39.5 11/26/2020   MCV 90 11/26/2020   PLT 221 11/26/2020   Lab Results  Component Value Date   TSH 3.160 11/26/2020    Lab Results  Component Value Date   CREATININE 0.81 11/26/2020   BUN 8 11/26/2020   NA 139 11/26/2020   K 4.2 11/26/2020   CL 104 11/26/2020   CO2  21 11/26/2020   Lab Results  Component Value Date   ALT 22 11/26/2020   AST 20 11/26/2020   ALKPHOS 78 11/26/2020   BILITOT 0.7 11/26/2020    Patient Active Problem List   Diagnosis Date Noted  . Tinea pedis 12/01/2020  . It band syndrome, right 02/05/2020  . Obesity (BMI 30-39.9) 12/03/2019  . Hematuria, microscopic 03/04/2019  . Colitis 03/02/2019  . Prediabetes 11/27/2017  . Snoring 11/24/2016  . Screening mammogram, encounter for 11/24/2016  . Sleep apnea 01/03/2016  . Routine general medical examination at a health care facility 09/26/2013  . Essential hypertension 09/16/2012  . 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 Allergic rhinitis 07/02/2007  . GERD 07/02/2007   Past Medical History:  Diagnosis Date  . Abnormal Pap smear    cin-1  . Allergic rhinitis    seasonal  . Allergy   . Diabetes mellitus without complication (HCC)    boarderline per pt  . GERD (gastroesophageal reflux disease)   .  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 Hypertension    Past Surgical History:  Procedure Laterality Date  . CERVIX LESION DESTRUCTION    . COLPOSCOPY  1997   abnormal pap  . LAMINECTOMY AND MICRODISCECTOMY LUMBAR SPINE  12/2007   L5-S1  . PARTIAL HYSTERECTOMY  11/2005   bleeding  . WISDOM TOOTH EXTRACTION     Social History   Tobacco Use  . Smoking status: Never Smoker  . Smokeless tobacco: Never Used  Vaping Use  . Vaping Use: Never used  Substance Use Topics  . Alcohol use: Yes    Alcohol/week: 0.0 standard drinks    Comment: social  . Drug use: No   Family History  Problem Relation Age of Onset  . Hypertension Mother 26  . Hypertension Father   . Diabetes Paternal Grandfather   . Breast cancer Maternal Grandmother   . Colon cancer Other        M.Great aunt  . Stroke Paternal Grandmother   . Stomach cancer Neg Hx   . Pancreatic cancer Neg Hx   . Esophageal cancer Neg Hx   . Rectal cancer Neg Hx    Allergies  Allergen Reactions  . Lisinopril     Cough   . Morphine     REACTION: skin crawling  . Oxycodone Itching  . Rofecoxib     REACTION: blurred vision, dizziness   Current Outpatient Medications on File Prior to Visit  Medication Sig Dispense Refill  . acetaminophen (TYLENOL) 325 MG tablet Take 650 mg by mouth every 6 (six) hours as needed.    . cyclobenzaprine (FLEXERIL) 10 MG tablet Take 1 tablet (10 mg total) by mouth 3 (three) times daily as needed for muscle spasms. Caution of sedation 30 tablet 1  . fluticasone (FLONASE) 50 MCG/ACT nasal spray Place 2 sprays into both nostrils daily. 48 g 3  . losartan (COZAAR) 50 MG tablet TAKE 1 TABLET BY MOUTH  DAILY 90 tablet 1  . Multiple Vitamin (MULTIVITAMIN) capsule Take 1 capsule by mouth daily.    . valACYclovir (VALTREX) 1000 MG tablet TAKE 1/2 TABLET  BY MOUTH 2 TIMES DAILY AS NEEDED. 90 tablet 1   No current facility-administered medications on file prior to visit.     Review of  Systems  Constitutional: Negative for activity change, appetite change, fatigue, fever and unexpected weight change.  HENT: Negative for congestion, ear pain, rhinorrhea, sinus pressure and sore throat.   Eyes:  Negative for pain, redness and visual disturbance.  Respiratory: Negative for cough, shortness of breath and wheezing.   Cardiovascular: Negative for chest pain and palpitations.  Gastrointestinal: Negative for abdominal pain, blood in stool, constipation and diarrhea.  Endocrine: Negative for polydipsia and polyuria.  Genitourinary: Negative for dysuria, frequency and urgency.  Musculoskeletal: Negative for arthralgias, back pain and myalgias.  Skin: Positive for rash. Negative for pallor.       Itchy rash on feet  Allergic/Immunologic: Negative for environmental allergies.  Neurological: Negative for dizziness, syncope and headaches.  Hematological: Negative for adenopathy. Does not bruise/bleed easily.  Psychiatric/Behavioral: Positive for sleep disturbance. Negative for decreased concentration and dysphoric mood. The patient is not nervous/anxious.        Objective:   Physical Exam Constitutional:      General: She is not in acute distress.    Appearance: Normal appearance. She is well-developed. She is obese. She is not ill-appearing or diaphoretic.  HENT:     Head: Normocephalic and atraumatic.     Right Ear: Tympanic membrane, ear canal and external ear normal.     Left Ear: Tympanic membrane, ear canal and external ear normal.     Nose: Nose normal. No congestion.     Mouth/Throat:     Mouth: Mucous membranes are moist.     Pharynx: Oropharynx is clear. No posterior oropharyngeal erythema.  Eyes:     General: No scleral icterus.    Extraocular Movements: Extraocular movements intact.     Conjunctiva/sclera: Conjunctivae normal.     Pupils: Pupils are equal, round, and reactive to light.  Neck:     Thyroid: No thyromegaly.     Vascular: No carotid bruit or JVD.   Cardiovascular:     Rate and Rhythm: Normal rate and regular rhythm.     Pulses: Normal pulses.     Heart sounds: Normal heart sounds. No gallop.   Pulmonary:     Effort: Pulmonary effort is normal. No respiratory distress.     Breath sounds: Normal breath sounds. No wheezing.     Comments: Good air exch Chest:     Chest wall: No tenderness.  Abdominal:     General: Bowel sounds are normal. There is no distension or abdominal bruit.     Palpations: Abdomen is soft. There is no mass.     Tenderness: There is no abdominal tenderness.     Hernia: No hernia is present.  Genitourinary:    Comments: Breast and pelvic exam done by that provider Musculoskeletal:        General: No tenderness. Normal range of motion.     Cervical back: Normal range of motion and neck supple. No rigidity. No muscular tenderness.     Right lower leg: No edema.     Left lower leg: No edema.  Lymphadenopathy:     Cervical: No cervical adenopathy.  Skin:    General: Skin is warm and dry.     Coloration: Skin is not pale.     Findings: No erythema or rash.     Comments: Few skin tags  Neurological:     Mental Status: She is alert. Mental status is at baseline.     Cranial Nerves: No cranial nerve deficit.     Motor: No abnormal muscle tone.     Coordination: Coordination normal.     Gait: Gait normal.     Deep Tendon Reflexes: Reflexes are normal and symmetric. Reflexes normal.  Psychiatric:  Mood and Affect: Mood normal.        Cognition and Memory: Cognition and memory normal.           Assessment & Plan:   Problem List Items Addressed This Visit      Cardiovascular and Mediastinum   Essential hypertension    bp in fair control at this time  BP Readings from Last 1 Encounters:  12/01/20 138/80   No changes needed Most recent labs reviewed  Disc lifstyle change with low sodium diet and exercise  Plans to continue losartan 50 mg daily        Musculoskeletal and Integument    Tinea pedis    With scale on feet and maceration between 4, 5th toes with itching  Has failed otc spray Px ketoconazole cream today to use bid  inst to keep feet dry and sanitize shoes and bathroom, shower floor  Update if no improvement       Relevant Medications   ketoconazole (NIZORAL) 2 % cream     Other   Encounter for general adult medical examination with abnormal findings - Primary    Reviewed health habits including diet and exercise and skin cancer prevention Reviewed appropriate screening tests for age  Also reviewed health mt list, fam hx and immunization status , as well as social and family history   See HPI Flu shot declined Answered questions about the covid vaccine-she is considering it  Will send for mammogram report from 01/08/20 at gyn as well as pap report (had had a hysterectomy) Encouraged her to gradually increase exercise as tolerated       Prediabetes    Lab Results  Component Value Date   HGBA1C 5.8 (H) 11/26/2020   disc imp of low glycemic diet and wt loss to prevent DM2  Plans to re start her metformin       Obesity (BMI 30-39.9)    Discussed how this problem influences overall health and the risks it imposes  Reviewed plan for weight loss with lower calorie diet (via better food choices and also portion control or program like weight watchers) and exercise building up to or more than 30 minutes 5 days per week including some aerobic activity         Relevant Medications   metFORMIN (GLUCOPHAGE-XR) 500 MG 24 hr tablet

## 2020-12-01 NOTE — Assessment & Plan Note (Signed)
bp in fair control at this time  BP Readings from Last 1 Encounters:  12/01/20 138/80   No changes needed Most recent labs reviewed  Disc lifstyle change with low sodium diet and exercise  Plans to continue losartan 50 mg daily

## 2020-12-01 NOTE — Assessment & Plan Note (Signed)
Reviewed health habits including diet and exercise and skin cancer prevention Reviewed appropriate screening tests for age  Also reviewed health mt list, fam hx and immunization status , as well as social and family history   See HPI Flu shot declined Answered questions about the covid vaccine-she is considering it  Will send for mammogram report from 01/08/20 at gyn as well as pap report (had had a hysterectomy) Encouraged her to gradually increase exercise as tolerated

## 2020-12-01 NOTE — Patient Instructions (Addendum)
Get back on metformin when you can  Stay active - keep advancing exercise as tolerated   Think about covid immunizations COVID-19 Vaccine Information can be found at: [employer] For questions related to vaccine distribution or appointments, please email [email or web address] or call [phone].   Look for volarian as an herbal supplement  Magnesium can also help (watch for diarrhea)    For feet - use ketoconazole cream

## 2020-12-01 NOTE — Assessment & Plan Note (Signed)
Lab Results  Component Value Date   HGBA1C 5.8 (H) 11/26/2020   disc imp of low glycemic diet and wt loss to prevent DM2  Plans to re start her metformin

## 2020-12-01 NOTE — Assessment & Plan Note (Signed)
Discussed how this problem influences overall health and the risks it imposes  Reviewed plan for weight loss with lower calorie diet (via better food choices and also portion control or program like weight watchers) and exercise building up to or more than 30 minutes 5 days per week including some aerobic activity    

## 2020-12-01 NOTE — Assessment & Plan Note (Signed)
With scale on feet and maceration between 4, 5th toes with itching  Has failed otc spray Px ketoconazole cream today to use bid  inst to keep feet dry and sanitize shoes and bathroom, shower floor  Update if no improvement

## 2020-12-11 IMAGING — MG DIGITAL SCREENING BILATERAL MAMMOGRAM WITH TOMO AND CAD
3 series · 3 of 7 positions shown · non-contrast
Comparison: Previous exam(s).

CLINICAL DATA: Screening.

EXAM:
DIGITAL SCREENING BILATERAL MAMMOGRAM WITH TOMO AND CAD

[R MLO synth-2D]
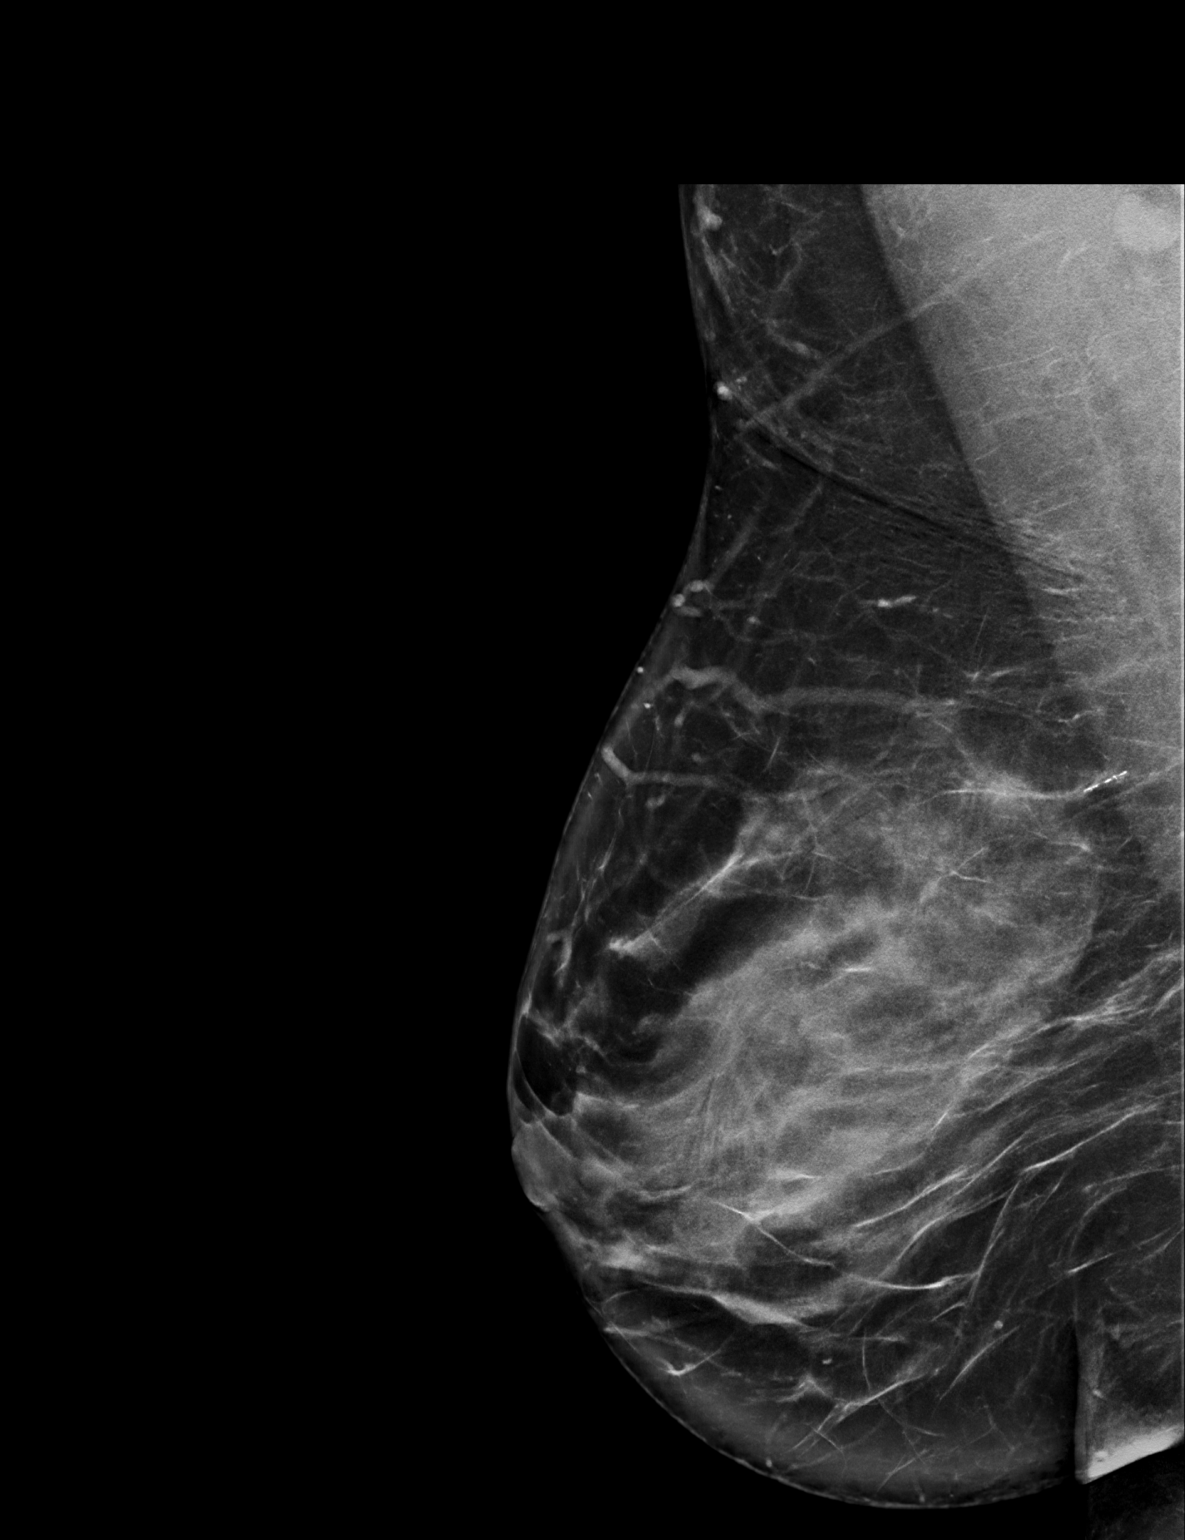

[L MLO synth-2D]
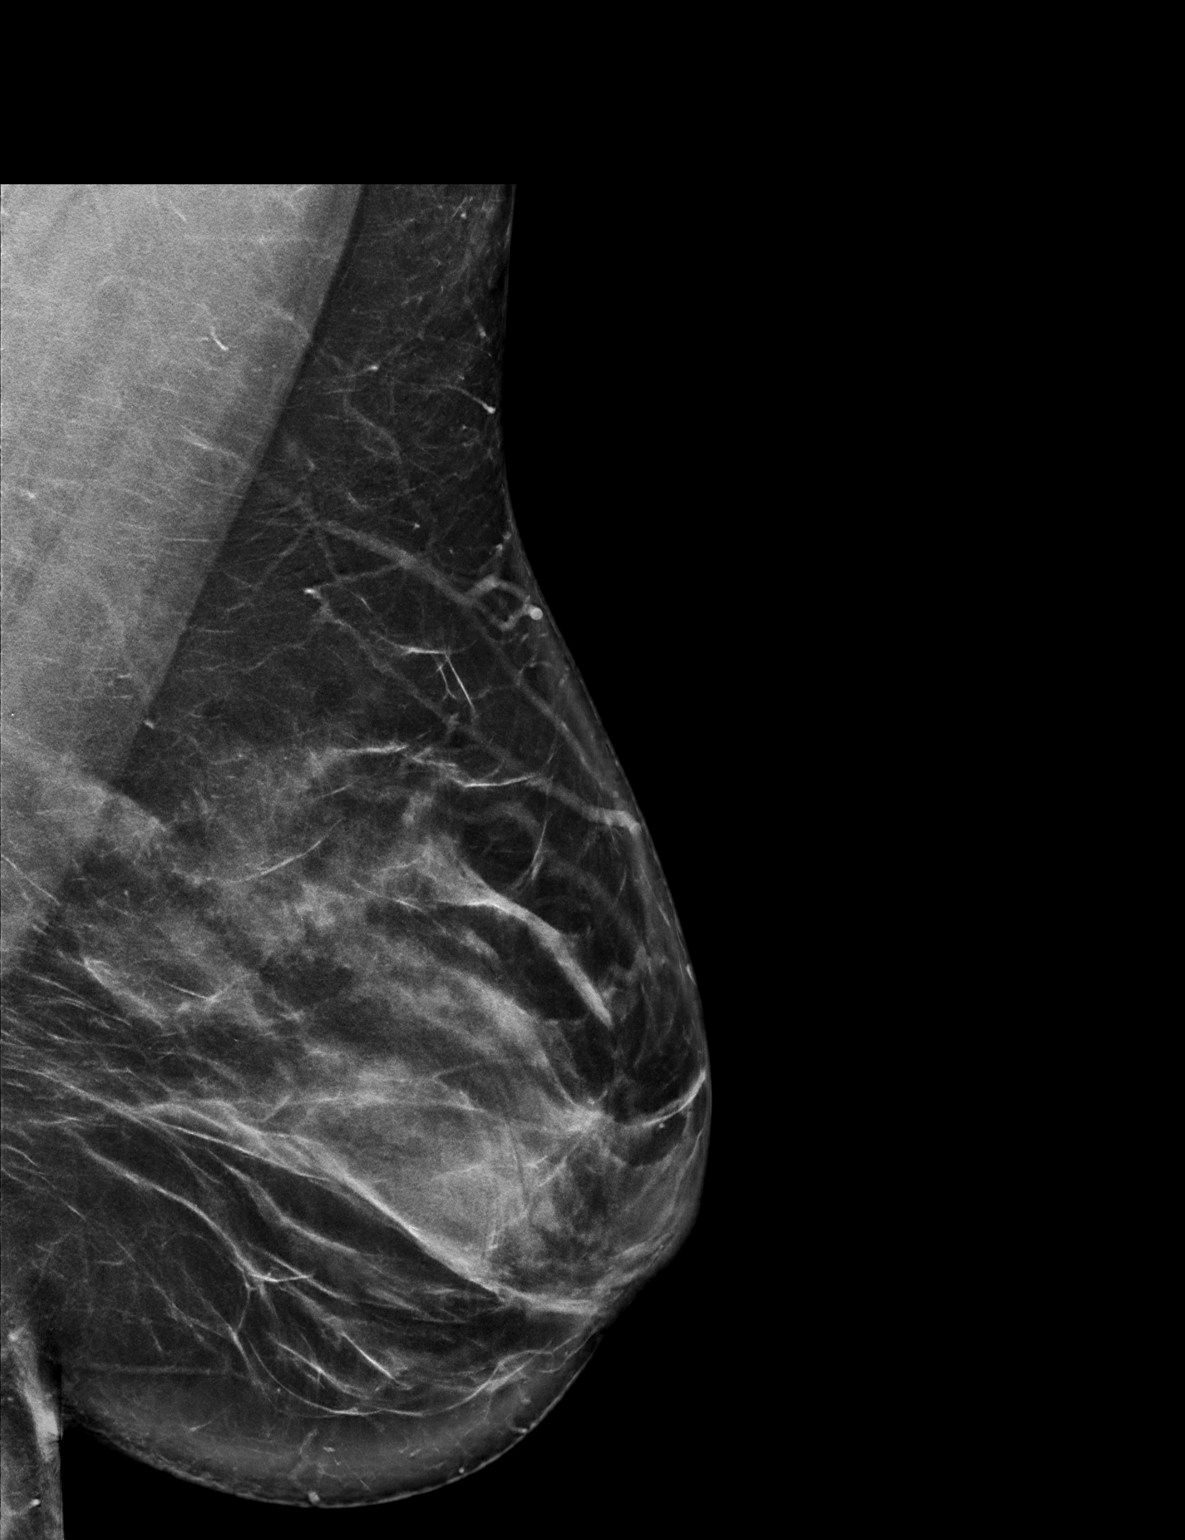

[R CC tomo · tomo slice 31/62.0]
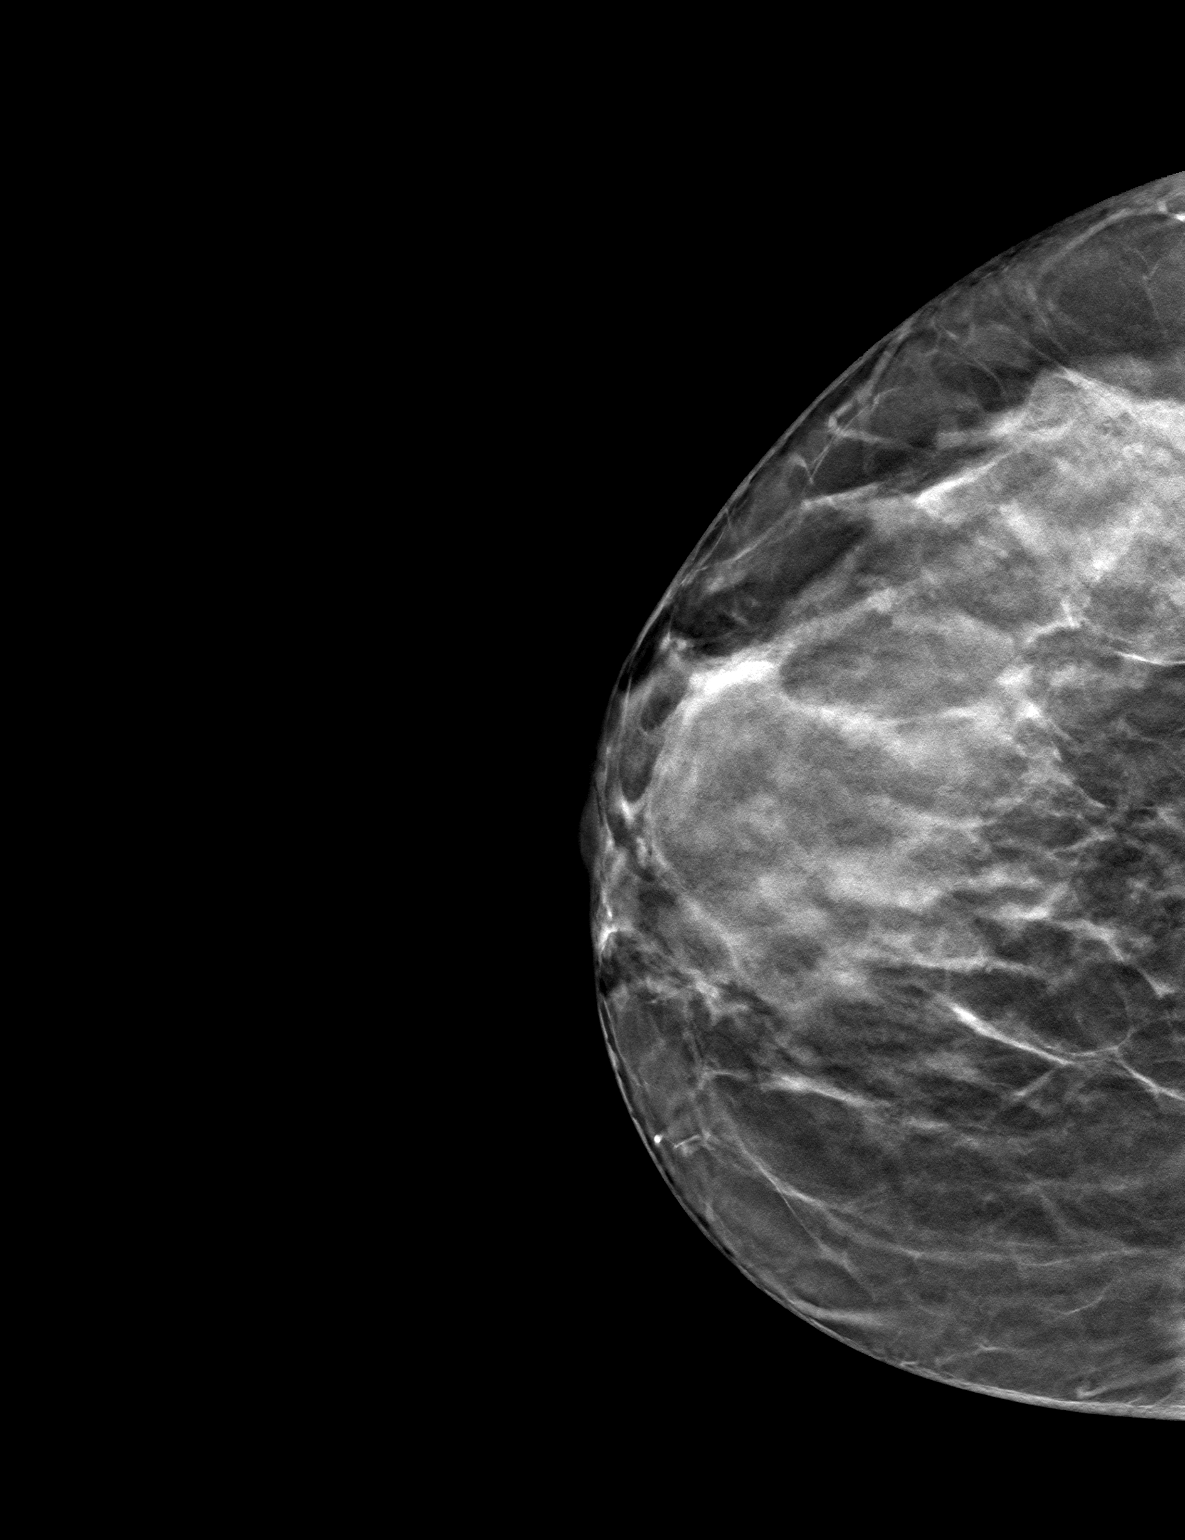

[3 of 7 positions shown; findings below may reference images not displayed]

ACR Breast Density Category c: The breast tissue is heterogeneously
dense, which may obscure small masses.
FINDINGS: There are no findings suspicious for malignancy. Images were
processed with CAD.
IMPRESSION: No mammographic evidence of malignancy. A result letter of this
screening mammogram will be mailed directly to the patient.

RECOMMENDATION:
Screening mammogram in one year. (Code:[id number]-[id number])

BI-RADS CATEGORY  1: Negative.

## 2021-01-17 LAB — HM MAMMOGRAPHY: HM Mammogram: NORMAL (ref 0–4)

## 2021-02-14 IMAGING — US ULTRASOUND ABDOMEN LIMITED
1 series · 4 of 4 positions shown · non-contrast
Comparison: None available

CLINICAL DATA: Large volume ascites described on abdomen CT from
outside institution

EXAM:
LIMITED ABDOMEN ULTRASOUND FOR ASCITES
TECHNIQUE: Limited ultrasound survey for ascites was performed in all four
abdominal quadrants.

[Series 1: ir (id) (id)/(id)/(id) ir · 4 of 4 slices shown]
[im 1/4]
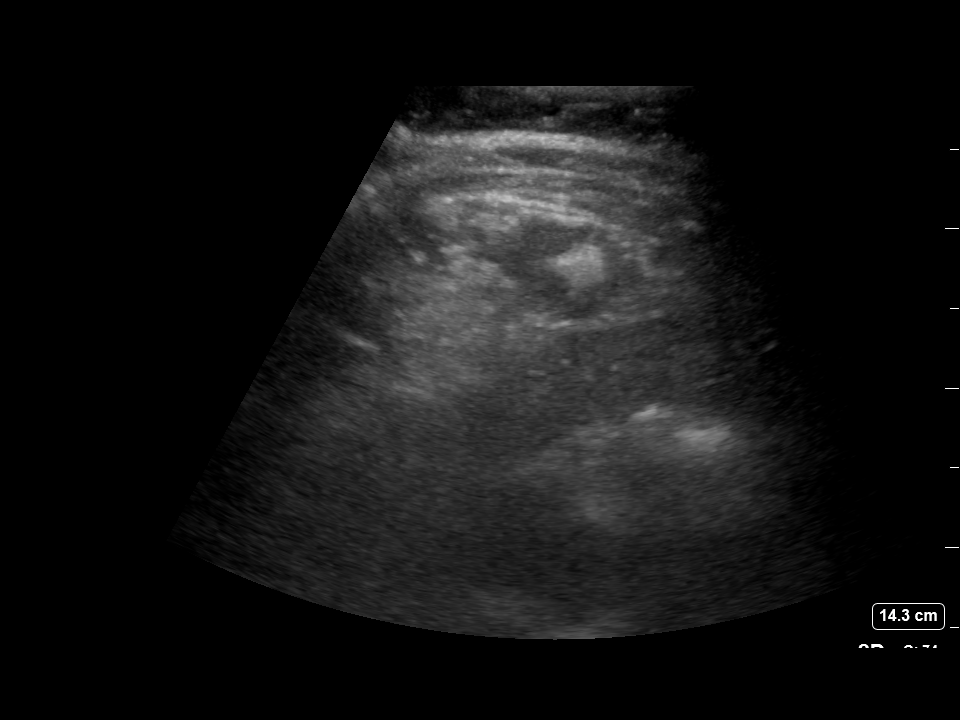
[im 2/4]
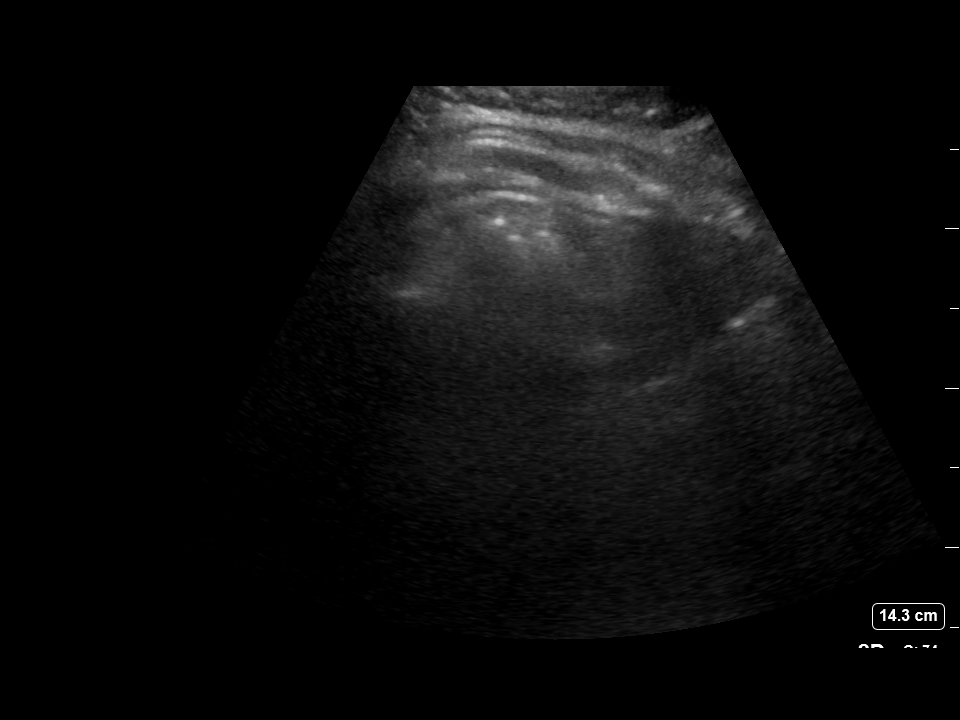
[im 3/4]
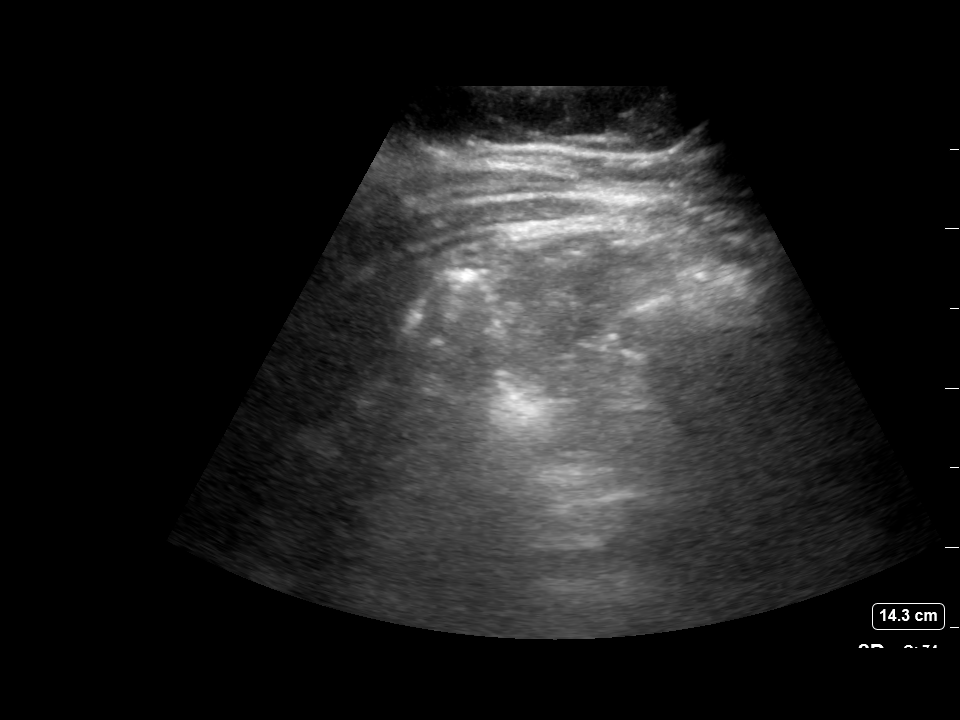
[im 4/4]
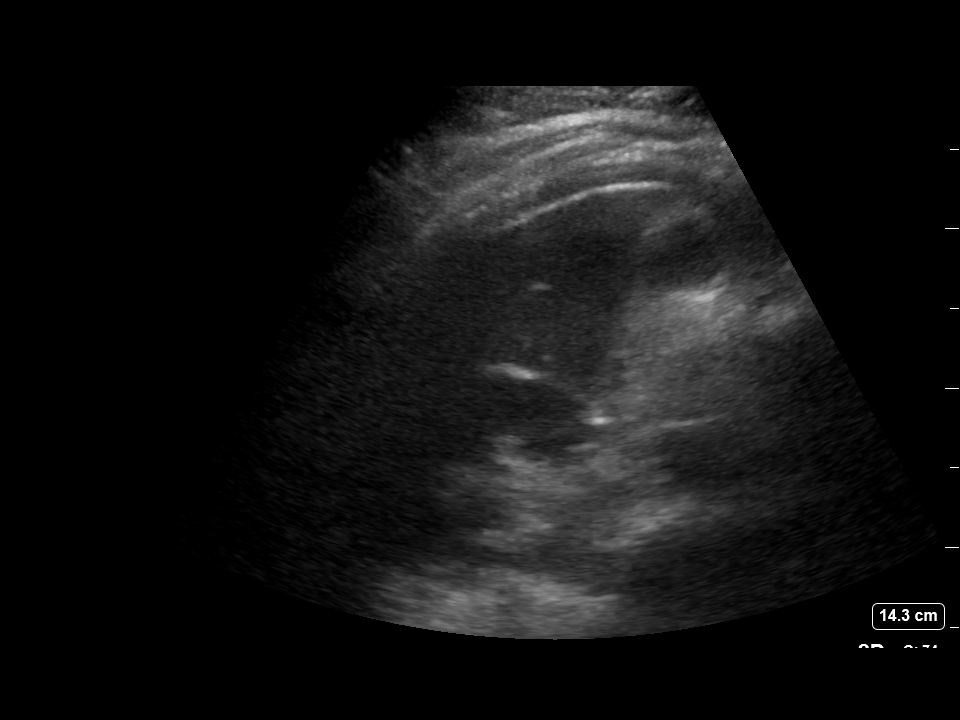

[4 of 4 positions shown; findings below may reference images not displayed]

FINDINGS: No abdominal ascites was identified on survey interrogation of the
peritoneal cavity
IMPRESSION: No visible abdominal ascites.  Paracentesis deferred.

## 2021-04-11 ENCOUNTER — Other Ambulatory Visit: Payer: Self-pay

## 2021-04-11 ENCOUNTER — Encounter: Payer: Self-pay | Admitting: Family Medicine

## 2021-04-11 ENCOUNTER — Ambulatory Visit (INDEPENDENT_AMBULATORY_CARE_PROVIDER_SITE_OTHER): Payer: Managed Care, Other (non HMO) | Admitting: Family Medicine

## 2021-04-11 VITALS — BP 132/82 | HR 80 | Temp 97.7°F | Ht 62.5 in | Wt 202.0 lb

## 2021-04-11 DIAGNOSIS — B079 Viral wart, unspecified: Secondary | ICD-10-CM | POA: Diagnosis not present

## 2021-04-11 NOTE — Patient Instructions (Signed)
Liquid Nitrogen applied today - you will likely get a blister - if you notice redness or swelling or worsening pain return - the wart may return -- if this is the case another round of liquid nitrogen can be helpful or you may want to consider topical treatment.

## 2021-04-11 NOTE — Progress Notes (Signed)
   Subjective:     [name] is a 45 y.o. female presenting for Toe Pain (R foot, pain between 4th and 5th metatarsal)     HPI  #Toe pain - started 2 months ago - was tolerating  - has noticed a fluid pocket between the toes - at the bend of the toe would get pain - does not recall an injury - early on did not recall any injury - is able to bend the toe - the pressure of walking the side pressure is painful   Review of Systems   Social History   Tobacco Use  Smoking Status Never Smoker  Smokeless Tobacco Never Used        Objective:    BP Readings from Last 3 Encounters:  04/11/21 132/82  12/01/20 138/80  02/05/20 126/82   Wt Readings from Last 3 Encounters:  04/11/21 202 lb (91.6 kg)  12/01/20 204 lb 4 oz (92.6 kg)  02/05/20 204 lb 1 oz (92.6 kg)    BP 132/82   Pulse 80   Temp 97.7 F (36.5 C) (Temporal)   Ht 5' 2.5" (1.588 m)   Wt 202 lb (91.6 kg)   LMP 05/09/2006   SpO2 98%   BMI 36.36 kg/m    Physical Exam Constitutional:      General: She is not in acute distress.    Appearance: She is well-developed. She is not diaphoretic.  HENT:     Right Ear: External ear normal.     Left Ear: External ear normal.  Eyes:     Conjunctiva/sclera: Conjunctivae normal.  Cardiovascular:     Rate and Rhythm: Normal rate.  Pulmonary:     Effort: Pulmonary effort is normal.  Musculoskeletal:     Cervical back: Neck supple.  Skin:    General: Skin is warm and dry.     Capillary Refill: Capillary refill takes less than 2 seconds.     Comments: Right 5th digit with verrucous skin lesion on the inner portion. Also white discoloration in the webbing between the 4th and 5th digit.   Neurological:     Mental Status: She is alert. Mental status is at baseline.  Psychiatric:        Mood and Affect: Mood normal.        Behavior: Behavior normal.           Assessment & Plan:   Problem List Items Addressed This Visit   None   Visit  Diagnoses    Viral warts, unspecified type    -  Primary     Discussed options of cryotherapy vs OTC therapy and she elected to do cryotherapy. Discussed if not completely resolved may need f/u appointment or to do OTC therapy.    Procedure: Cryotherapy Indication: wart  Verbal consent for procedure obtained from patient. Discussed risk for blister and that lesion may return.   Liquid nitrogen was applied for 10 seconds for 3 cycles to obtain complete freezing with waiting for complete thawing between cycles.   Pt tolerated procedure well. Return precautions including signs of infection discussed with patient.    Return if symptoms worsen or fail to improve.  Lynnda Child, MD  This visit occurred during the SARS-CoV-2 public health emergency.  Safety protocols were in place, including screening questions prior to the visit, additional usage of staff PPE, and extensive cleaning of exam room while observing appropriate contact time as indicated for disinfecting solutions.

## 2021-08-25 ENCOUNTER — Other Ambulatory Visit: Payer: Self-pay | Admitting: Family Medicine

## 2021-09-22 ENCOUNTER — Telehealth: Payer: Self-pay | Admitting: Family Medicine

## 2021-09-22 DIAGNOSIS — R7303 Prediabetes: Secondary | ICD-10-CM

## 2021-09-22 DIAGNOSIS — I10 Essential (primary) hypertension: Secondary | ICD-10-CM

## 2021-09-22 NOTE — Telephone Encounter (Signed)
CPE isn't scheduled until 12/02/21 but if PCP orders labs for [facility] they will stay in their system for 6 months.

## 2021-09-22 NOTE — Telephone Encounter (Signed)
Pt needs orders to have her lab work done at [facility]

## 2021-09-25 NOTE — Addendum Note (Signed)
Addended by: Roxy Manns A on: 09/25/2021 01:56 PM   Modules accepted: Orders

## 2021-09-25 NOTE — Telephone Encounter (Signed)
I ordered [facility] lab collect for everything  Thanks

## 2021-11-03 ENCOUNTER — Other Ambulatory Visit: Payer: Self-pay

## 2021-11-03 MED ORDER — LOSARTAN POTASSIUM 50 MG PO TABS: 50.0000 mg | ORAL_TABLET | Freq: Every day | ORAL | 0 refills | Status: DC

## 2021-11-24 ENCOUNTER — Telehealth: Payer: Self-pay | Admitting: Family Medicine

## 2021-11-24 DIAGNOSIS — R7303 Prediabetes: Secondary | ICD-10-CM

## 2021-11-24 DIAGNOSIS — I10 Essential (primary) hypertension: Secondary | ICD-10-CM

## 2021-11-24 NOTE — Telephone Encounter (Signed)
Pt called in stating that she is trying to get her labs done but [facility] is stating that [facility] has not released the orders to get blood work done there. Pt is scheduled for Monday to get lab work done  [phone] fax number  [facility] [place] in [place]

## 2021-11-24 NOTE — Telephone Encounter (Signed)
ERROR

## 2021-11-25 NOTE — Telephone Encounter (Signed)
Labs are done

## 2021-11-25 NOTE — Telephone Encounter (Signed)
Labs are not ordered to go to [facility] they are ordered future like she is coming here, Dr. Milinda Antis please reorder labs for [facility] draw and I will release them, thanks

## 2021-11-25 NOTE — Telephone Encounter (Signed)
Orders released and left VM letting pt know

## 2021-11-25 NOTE — Telephone Encounter (Signed)
Pt called in to know status of lab orders . Would like a call back #[phone]

## 2021-11-29 LAB — CBC WITH DIFFERENTIAL/PLATELET
Basophils Absolute: 0 10*3/uL (ref 0.0–0.2)
Basos: 1 %
EOS (ABSOLUTE): 0.1 10*3/uL (ref 0.0–0.4)
Eos: 2 %
Hematocrit: 40.2 % (ref 34.0–46.6)
Hemoglobin: 13.5 g/dL (ref 11.1–15.9)
Immature Grans (Abs): 0 10*3/uL (ref 0.0–0.1)
Immature Granulocytes: 1 %
Lymphocytes Absolute: 1.7 10*3/uL (ref 0.7–3.1)
Lymphs: 27 %
MCH: 30 pg (ref 26.6–33.0)
MCHC: 33.6 g/dL (ref 31.5–35.7)
MCV: 89 fL (ref 79–97)
Monocytes Absolute: 0.6 10*3/uL (ref 0.1–0.9)
Monocytes: 9 %
Neutrophils Absolute: 3.7 10*3/uL (ref 1.4–7.0)
Neutrophils: 60 %
Platelets: 234 10*3/uL (ref 150–450)
RBC: 4.5 x10E6/uL (ref 3.77–5.28)
RDW: 12.8 % (ref 11.7–15.4)
WBC: 6.1 10*3/uL (ref 3.4–10.8)

## 2021-11-29 LAB — COMPREHENSIVE METABOLIC PANEL
ALT: 11 IU/L (ref 0–32)
AST: 16 IU/L (ref 0–40)
Albumin/Globulin Ratio: 2.3 — ABNORMAL HIGH (ref 1.2–2.2)
Albumin: 4.6 g/dL (ref 3.8–4.8)
Alkaline Phosphatase: 73 IU/L (ref 44–121)
BUN/Creatinine Ratio: 8 — ABNORMAL LOW (ref 9–23)
BUN: 6 mg/dL (ref 6–24)
Bilirubin Total: 0.6 mg/dL (ref 0.0–1.2)
CO2: 23 mmol/L (ref 20–29)
Calcium: 8.9 mg/dL (ref 8.7–10.2)
Chloride: 101 mmol/L (ref 96–106)
Creatinine, Ser: 0.78 mg/dL (ref 0.57–1.00)
Globulin, Total: 2 g/dL (ref 1.5–4.5)
Glucose: 109 mg/dL — ABNORMAL HIGH (ref 70–99)
Potassium: 4.1 mmol/L (ref 3.5–5.2)
Sodium: 137 mmol/L (ref 134–144)
Total Protein: 6.6 g/dL (ref 6.0–8.5)
eGFR: 95 mL/min/{1.73_m2} (ref >59)

## 2021-11-29 LAB — LIPID PANEL
Chol/HDL Ratio: 3.1 ratio (ref 0.0–4.4)
Cholesterol, Total: 165 mg/dL (ref 100–199)
HDL: 54 mg/dL (ref >39)
LDL Chol Calc (NIH): 94 mg/dL (ref 0–99)
Triglycerides: 90 mg/dL (ref 0–149)
VLDL Cholesterol Cal: 17 mg/dL (ref 5–40)

## 2021-11-29 LAB — TSH: TSH: 3.93 u[IU]/mL (ref 0.450–4.500)

## 2021-11-29 LAB — HEMOGLOBIN A1C
Est. average glucose Bld gHb Est-mCnc: 117 mg/dL
Hgb A1c MFr Bld: 5.7 % — ABNORMAL HIGH (ref 4.8–5.6)

## 2021-12-02 ENCOUNTER — Other Ambulatory Visit: Payer: Self-pay

## 2021-12-02 ENCOUNTER — Ambulatory Visit (INDEPENDENT_AMBULATORY_CARE_PROVIDER_SITE_OTHER): Payer: Managed Care, Other (non HMO) | Admitting: Family Medicine

## 2021-12-02 ENCOUNTER — Telehealth: Payer: Self-pay | Admitting: Family Medicine

## 2021-12-02 ENCOUNTER — Encounter: Payer: Self-pay | Admitting: Family Medicine

## 2021-12-02 VITALS — BP 130/88 | HR 81 | Temp 98.2°F | Ht 63.0 in | Wt 205.2 lb

## 2021-12-02 DIAGNOSIS — I10 Essential (primary) hypertension: Secondary | ICD-10-CM

## 2021-12-02 DIAGNOSIS — J3089 Other allergic rhinitis: Secondary | ICD-10-CM

## 2021-12-02 DIAGNOSIS — R7303 Prediabetes: Secondary | ICD-10-CM | POA: Diagnosis not present

## 2021-12-02 DIAGNOSIS — B353 Tinea pedis: Secondary | ICD-10-CM

## 2021-12-02 DIAGNOSIS — E669 Obesity, unspecified: Secondary | ICD-10-CM | POA: Diagnosis not present

## 2021-12-02 DIAGNOSIS — K219 Gastro-esophageal reflux disease without esophagitis: Secondary | ICD-10-CM

## 2021-12-02 DIAGNOSIS — Z0001 Encounter for general adult medical examination with abnormal findings: Secondary | ICD-10-CM

## 2021-12-02 MED ORDER — LOSARTAN POTASSIUM 50 MG PO TABS: 50.0000 mg | ORAL_TABLET | Freq: Every day | ORAL | 3 refills | Status: DC

## 2021-12-02 MED ORDER — FLUTICASONE PROPIONATE 50 MCG/ACT NA SUSP: 2.0000 | Freq: Every day | NASAL | 3 refills | Status: AC

## 2021-12-02 MED ORDER — KETOCONAZOLE 2 % EX CREA: 1.0000 "application " | TOPICAL_CREAM | Freq: Two times a day (BID) | CUTANEOUS | 3 refills | Status: DC

## 2021-12-02 NOTE — Patient Instructions (Addendum)
Get your covid booster when you can (the bivalent)   Magnesium 250 mg once daily may help cramps (watch out for diarrhea) Also mustard - a teaspoon a day helps also  Stretching can help also (calf)   Blood sugar is ok - you can stay off metformin if you want   Good luck with the diet /exercise and weight loss   Add exercise when you can

## 2021-12-02 NOTE — Assessment & Plan Note (Signed)
Small area on L foot is on /off  Ketoconazole helps-this was refilled today

## 2021-12-02 NOTE — Progress Notes (Signed)
Subjective:    Patient ID: [name], female    DOB: 1976/07/22, 45 y.o.   MRN: [id number]  This visit occurred during the SARS-CoV-2 public health emergency.  Safety protocols were in place, including screening questions prior to the visit, additional usage of staff PPE, and extensive cleaning of exam room while observing appropriate contact time as indicated for disinfecting solutions.   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  [employer] in the past (had to stop with her back surgery)  Does walk the dog right now   Working a lot  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  Not planning anything big for the holidays  Covid status  -had covid in 2020 She did get shots - end of last week at [facility]  Flu shot- did that in October  Tdap 12/18   Colon cancer screening- had colonoscopy in 2020 That was diagnostic No trouble since then   Mammogram -last was at [facility] [place] [facility] (January 17, 2021)- with her gyn visit  Self breast exam - no lumps  She had a hysterectomy No problems at all   HTN  bp is stable today  No cp or palpitations or headaches or edema  No side effects to medicines  bp is stable today  No cp or palpitations or headaches or edema  No side effects to medicines  BP Readings from Last 3 Encounters:  12/02/21 130/88  04/11/21 132/82  12/01/20  138/80     Losartan 50 mg daily    Pulse Readings from Last 3 Encounters:  12/02/21 81  04/11/21 80  12/01/20 98     Lab Results  Component Value Date   CREATININE 0.78 11/28/2021   BUN 6 11/28/2021   NA 137 11/28/2021   K 4.1 11/28/2021   CL 101 11/28/2021   CO2 23 11/28/2021    Cholesterol Lab Results  Component Value Date   CHOL 165 11/28/2021   CHOL 177 11/26/2020   CHOL 175 11/27/2019   Lab Results  Component Value Date   HDL 54 11/28/2021   HDL 65 11/26/2020   HDL 84 11/27/2019   Lab Results  Component Value Date   LDLCALC 94 11/28/2021   LDLCALC 96 11/26/2020   LDLCALC 81 11/27/2019   Lab Results  Component Value Date   TRIG 90 11/28/2021   TRIG 87 11/26/2020   TRIG 49 11/27/2019   Lab Results  Component Value Date   CHOLHDL 3.1 11/28/2021   CHOLHDL 2.7 11/26/2020   CHOLHDL 2.1 11/27/2019   No results found for: LDLDIRECT  Diet is about the same  She avoids beef and pork  Eats [place] Teressa Senter or chicken    Prediabetes Lab Results  Component  Value Date   HGBA1C 5.7 (H) 11/28/2021  This is stable  Metformin xr 500 mg daily - not taking right now    Other labs  Lab Results  Component Value Date   ALT 11 11/28/2021   AST 16 11/28/2021   ALKPHOS 73 11/28/2021   BILITOT 0.6 11/28/2021   Lab Results  Component Value Date   TSH 3.930 11/28/2021   Lab Results  Component Value Date   WBC 6.1 11/28/2021   HGB 13.5 11/28/2021   HCT 40.2 11/28/2021   MCV 89 11/28/2021   PLT 234 11/28/2021    Has trouble with cramps in the back of her R leg (calf) Happens when she points her toe   Patient Active Problem List   Diagnosis Date Noted   Tinea pedis 12/01/2020   It band syndrome, right 02/05/2020   Obesity (BMI 30-39.9) 12/03/2019   Hematuria, microscopic 03/04/2019   Colitis 03/02/2019   Prediabetes 11/27/2017   Snoring 11/24/2016   Screening mammogram, encounter for 11/24/2016   Sleep apnea 01/03/2016   Encounter for general  adult medical examination with abnormal findings 09/26/2013   Essential hypertension 09/16/2012   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   Allergic rhinitis 07/02/2007   GERD 07/02/2007   Past Medical History:  Diagnosis Date   Abnormal Pap smear    cin-1   Allergic rhinitis    seasonal   Allergy    Diabetes mellitus without complication (HCC)    boarderline per pt   GERD (gastroesophageal reflux disease)    Herpes simplex without mention of complication    hsv-2   Hypertension    Past Surgical History:  Procedure Laterality Date   CERVIX LESION DESTRUCTION     COLPOSCOPY  1997   abnormal pap   LAMINECTOMY AND MICRODISCECTOMY LUMBAR SPINE  12/2007   L5-S1   PARTIAL HYSTERECTOMY  11/2005   bleeding   WISDOM TOOTH EXTRACTION     Social History   Tobacco Use   Smoking status: Never   Smokeless tobacco: Never  Vaping Use   Vaping Use: Never used  Substance Use Topics   Alcohol use: Yes    Alcohol/week: 0.0 standard drinks    Comment: social   Drug use: No   Family History  Problem Relation Age of Onset   Hypertension Mother 35   Hypertension Father    Diabetes Paternal Grandfather    Breast cancer Maternal Grandmother    Colon cancer Other        M.Great aunt   Stroke Paternal Grandmother    Stomach cancer Neg Hx    Pancreatic cancer Neg Hx    Esophageal cancer Neg Hx    Rectal cancer Neg Hx    Allergies  Allergen Reactions   Lisinopril     Cough    Morphine     REACTION: skin crawling   Oxycodone Itching   Rofecoxib     REACTION: blurred vision, dizziness   Current Outpatient Medications on File Prior to Visit  Medication Sig Dispense Refill   Multiple Vitamin (MULTIVITAMIN) capsule Take 1 capsule by mouth daily.     valACYclovir (VALTREX) 1000 MG tablet TAKE 1/2 TABLET  BY MOUTH 2 TIMES DAILY AS NEEDED. 90 tablet 1   No current facility-administered medications on file prior to visit.    Review of Systems  Constitutional:  Positive for fatigue.  Negative for activity change, appetite change, fever and unexpected weight change.       Fatigued is some improved  with better diet   HENT:  Negative for congestion, ear pain, rhinorrhea, sinus pressure and sore throat.   Eyes:  Negative for pain, redness and visual disturbance.  Respiratory:  Negative for cough, shortness of breath and wheezing.   Cardiovascular:  Negative for chest pain and palpitations.  Gastrointestinal:  Negative for abdominal pain, blood in stool, constipation and diarrhea.  Endocrine: Negative for polydipsia and polyuria.  Genitourinary:  Negative for dysuria, frequency and urgency.  Musculoskeletal:  Negative for arthralgias, back pain and myalgias.  Skin:  Negative for pallor and rash.  Allergic/Immunologic: Negative for environmental allergies.  Neurological:  Negative for dizziness, syncope and headaches.  Hematological:  Negative for adenopathy. Does not bruise/bleed easily.  Psychiatric/Behavioral:  Negative for decreased concentration and dysphoric mood. The patient is not nervous/anxious.       Objective:   Physical Exam Constitutional:      General: She is not in acute distress.    Appearance: Normal appearance. She is well-developed. She is obese. She is not ill-appearing or diaphoretic.  HENT:     Head: Normocephalic and atraumatic.     Right Ear: Tympanic membrane, ear canal and external ear normal.     Left Ear: Tympanic membrane, ear canal and external ear normal.     Nose: Nose normal. No congestion.     Mouth/Throat:     Mouth: Mucous membranes are moist.     Pharynx: Oropharynx is clear. No posterior oropharyngeal erythema.  Eyes:     General: No scleral icterus.    Extraocular Movements: Extraocular movements intact.     Conjunctiva/sclera: Conjunctivae normal.     Pupils: Pupils are equal, round, and reactive to light.  Neck:     Thyroid: No thyromegaly.     Vascular: No carotid bruit or JVD.  Cardiovascular:     Rate and Rhythm:  Normal rate and regular rhythm.     Pulses: Normal pulses.     Heart sounds: Normal heart sounds.    No gallop.  Pulmonary:     Effort: Pulmonary effort is normal. No respiratory distress.     Breath sounds: Normal breath sounds. No wheezing.     Comments: Good air exch Chest:     Chest wall: No tenderness.  Abdominal:     General: Bowel sounds are normal. There is no distension or abdominal bruit.     Palpations: Abdomen is soft. There is no mass.     Tenderness: There is no abdominal tenderness.     Hernia: No hernia is present.  Genitourinary:    Comments: Breast and pelvic exam done by gyn provider   Musculoskeletal:        General: No tenderness. Normal range of motion.     Cervical back: Normal range of motion and neck supple. No rigidity. No muscular tenderness.     Right lower leg: No edema.     Left lower leg: No edema.  Lymphadenopathy:     Cervical: No cervical adenopathy.  Skin:    General: Skin is warm and dry.     Coloration: Skin is not pale.     Findings: No erythema or rash.     Comments: Few skin tags  Neurological:     Mental Status: She is alert. Mental status is at baseline.     Cranial Nerves: No cranial nerve deficit.     Motor: No abnormal muscle tone.     Coordination: Coordination normal.     Gait: Gait normal.  Deep Tendon Reflexes: Reflexes are normal and symmetric. Reflexes normal.  Psychiatric:        Mood and Affect: Mood normal.        Cognition and Memory: Cognition and memory normal.          Assessment & Plan:   Problem List Items Addressed This Visit       Cardiovascular and Mediastinum   Essential hypertension    bp in fair control at this time  BP Readings from Last 1 Encounters:  12/02/21 130/88  No changes needed Most recent labs reviewed  Disc lifstyle change with low sodium diet and exercise  Plan to continue losartan 50 mg daily  Encouraged her to continue wt loss program      Relevant Medications   losartan  (COZAAR) 50 MG tablet     Respiratory   Allergic rhinitis    Refilled flonase for prn use        Digestive   GERD    Controlled with diet         Musculoskeletal and Integument   Tinea pedis    Small area on L foot is on /off  Ketoconazole helps-this was refilled today      Relevant Medications   ketoconazole (NIZORAL) 2 % cream     Other   Encounter for general adult medical examination with abnormal findings - Primary    Reviewed health habits including diet and exercise and skin cancer prevention Reviewed appropriate screening tests for age  Also reviewed health mt list, fam hx and immunization status , as well as social and family history   See HPI Labs reviewed  Encouraged her to continue wt loss program and add exercise when ready  She had covid vaccines, enc to get bivalent booster Had flu shot  Mammogram utd-sent for report from gyn  Colonoscopy is utd from 2020        Prediabetes    Improved with better diet and exercise  Lab Results  Component Value Date   HGBA1C 5.7 (H) 11/28/2021   Encouraged her to continue wt loss and low glycemic idet       Obesity (BMI 30-39.9)    Discussed how this problem influences overall health and the risks it imposes  Reviewed plan for weight loss with lower calorie diet (via better food choices and also portion control or program like weight watchers) and exercise building up to or more than 30 minutes 5 days per week including some aerobic activity   2 wk into nutrasystem program and likes it  Down 4 lb  Good water intake  Plans to work on exercise plan next

## 2021-12-02 NOTE — Assessment & Plan Note (Signed)
Discussed how this problem influences overall health and the risks it imposes  Reviewed plan for weight loss with lower calorie diet (via better food choices and also portion control or program like weight watchers) and exercise building up to or more than 30 minutes 5 days per week including some aerobic activity   2 wk into nutrasystem program and likes it  Down 4 lb  Good water intake  Plans to work on exercise plan next

## 2021-12-02 NOTE — Assessment & Plan Note (Signed)
Reviewed health habits including diet and exercise and skin cancer prevention Reviewed appropriate screening tests for age  Also reviewed health mt list, fam hx and immunization status , as well as social and family history   See HPI Labs reviewed  Encouraged her to continue wt loss program and add exercise when ready  She had covid vaccines, enc to get bivalent booster Had flu shot  Mammogram utd-sent for report from gyn  Colonoscopy is utd from 2020

## 2021-12-02 NOTE — Telephone Encounter (Signed)
Mrs. Cindia stated that she wanted to let Dr. Milinda Antis of the covid vaccines. 1st 12/02/20 2nd dose 12/23/20 [employer]

## 2021-12-02 NOTE — Assessment & Plan Note (Signed)
Refilled flonase for prn use

## 2021-12-02 NOTE — Telephone Encounter (Signed)
Maked in chart.

## 2021-12-02 NOTE — Assessment & Plan Note (Signed)
Improved with better diet and exercise  Lab Results  Component Value Date   HGBA1C 5.7 (H) 11/28/2021   Encouraged her to continue wt loss and low glycemic idet

## 2021-12-02 NOTE — Assessment & Plan Note (Signed)
Controlled with diet 

## 2021-12-02 NOTE — Assessment & Plan Note (Signed)
bp in fair control at this time  BP Readings from Last 1 Encounters:  12/02/21 130/88   No changes needed Most recent labs reviewed  Disc lifstyle change with low sodium diet and exercise  Plan to continue losartan 50 mg daily  Encouraged her to continue wt loss program

## 2022-01-18 LAB — HM MAMMOGRAPHY: HM Mammogram: NORMAL (ref 0–4)

## 2022-08-10 ENCOUNTER — Emergency Department
Admission: EM | Admit: 2022-08-10 | Discharge: 2022-08-10 | Disposition: A | Discharge status: Home / Self Care | Payer: Managed Care, Other (non HMO) | Attending: Emergency Medicine | Admitting: Emergency Medicine

## 2022-08-10 ENCOUNTER — Other Ambulatory Visit: Payer: Self-pay

## 2022-08-10 ENCOUNTER — Emergency Department: Payer: Managed Care, Other (non HMO)

## 2022-08-10 ENCOUNTER — Telehealth: Payer: Self-pay

## 2022-08-10 ENCOUNTER — Encounter: Payer: Self-pay | Admitting: Emergency Medicine

## 2022-08-10 DIAGNOSIS — R519 Headache, unspecified: Secondary | ICD-10-CM | POA: Diagnosis not present

## 2022-08-10 DIAGNOSIS — Z79899 Other long term (current) drug therapy: Secondary | ICD-10-CM | POA: Insufficient documentation

## 2022-08-10 DIAGNOSIS — E119 Type 2 diabetes mellitus without complications: Secondary | ICD-10-CM | POA: Insufficient documentation

## 2022-08-10 DIAGNOSIS — I10 Essential (primary) hypertension: Secondary | ICD-10-CM | POA: Diagnosis not present

## 2022-08-10 DIAGNOSIS — E039 Hypothyroidism, unspecified: Secondary | ICD-10-CM | POA: Insufficient documentation

## 2022-08-10 DIAGNOSIS — R030 Elevated blood-pressure reading, without diagnosis of hypertension: Secondary | ICD-10-CM | POA: Diagnosis present

## 2022-08-10 LAB — URINALYSIS, ROUTINE W REFLEX MICROSCOPIC
Bilirubin Urine: NEGATIVE
Glucose, UA: NEGATIVE mg/dL
Ketones, ur: 20 mg/dL — AB
Leukocytes,Ua: NEGATIVE
Nitrite: NEGATIVE
Protein, ur: NEGATIVE mg/dL
Specific Gravity, Urine: 1.013 (ref 1.005–1.030)
pH: 6 (ref 5.0–8.0)

## 2022-08-10 LAB — CBC WITH DIFFERENTIAL/PLATELET
Abs Immature Granulocytes: 0.02 10*3/uL (ref 0.00–0.07)
Basophils Absolute: 0 10*3/uL (ref 0.0–0.1)
Basophils Relative: 0 %
Eosinophils Absolute: 0.1 10*3/uL (ref 0.0–0.5)
Eosinophils Relative: 1 %
HCT: 42.5 % (ref 36.0–46.0)
Hemoglobin: 13.9 g/dL (ref 12.0–15.0)
Immature Granulocytes: 0 %
Lymphocytes Relative: 28 %
Lymphs Abs: 1.8 10*3/uL (ref 0.7–4.0)
MCH: 29.1 pg (ref 26.0–34.0)
MCHC: 32.7 g/dL (ref 30.0–36.0)
MCV: 88.9 fL (ref 80.0–100.0)
Monocytes Absolute: 0.4 10*3/uL (ref 0.1–1.0)
Monocytes Relative: 7 %
Neutro Abs: 4 10*3/uL (ref 1.7–7.7)
Neutrophils Relative %: 64 %
Platelets: 224 10*3/uL (ref 150–400)
RBC: 4.78 MIL/uL (ref 3.87–5.11)
RDW: 13.2 % (ref 11.5–15.5)
WBC: 6.3 10*3/uL (ref 4.0–10.5)
nRBC: 0 % (ref 0.0–0.2)

## 2022-08-10 LAB — COMPREHENSIVE METABOLIC PANEL
ALT: 17 U/L (ref 0–44)
AST: 20 U/L (ref 15–41)
Albumin: 4.6 g/dL (ref 3.5–5.0)
Alkaline Phosphatase: 66 U/L (ref 38–126)
Anion gap: 11 (ref 5–15)
BUN: 9 mg/dL (ref 6–20)
CO2: 24 mmol/L (ref 22–32)
Calcium: 9.1 mg/dL (ref 8.9–10.3)
Chloride: 101 mmol/L (ref 98–111)
Creatinine, Ser: 0.72 mg/dL (ref 0.44–1.00)
GFR, Estimated: >60 mL/min (ref >60)
Glucose, Bld: 104 mg/dL — ABNORMAL HIGH (ref 70–99)
Potassium: 3.4 mmol/L — ABNORMAL LOW (ref 3.5–5.1)
Sodium: 136 mmol/L (ref 135–145)
Total Bilirubin: 1.3 mg/dL — ABNORMAL HIGH (ref 0.3–1.2)
Total Protein: 7.9 g/dL (ref 6.5–8.1)

## 2022-08-10 LAB — TSH: TSH: 2.791 u[IU]/mL (ref 0.350–4.500)

## 2022-08-10 MED ORDER — CLONIDINE HCL 0.1 MG PO TABS
0.1000 mg | ORAL_TABLET | Freq: Once | ORAL | Status: AC
  Administered 2022-08-10: 0.1 mg via ORAL
  Filled 2022-08-10: qty 1

## 2022-08-10 MED ORDER — LOSARTAN POTASSIUM 50 MG PO TABS
50.0000 mg | ORAL_TABLET | ORAL | Status: AC
Start: 2022-08-10 — End: 2022-08-10
  Administered 2022-08-10: 50 mg via ORAL
  Filled 2022-08-10: qty 1

## 2022-08-10 NOTE — Telephone Encounter (Signed)
Aware, will watch for correspondence  

## 2022-08-10 NOTE — Telephone Encounter (Signed)
Noted  

## 2022-08-10 NOTE — Telephone Encounter (Signed)
I spoke with pt and she is walking into [facility] ED now. Sending note to Allayne Gitelman NP who pt already has appt to see on 08/11/22 at 7:40) prior to pt speaking with access nurse. Also sending note to Dr Milinda Antis who is out of office and Joellen CMA.

## 2022-08-10 NOTE — ED Triage Notes (Signed)
Patient to ED for hypertension. Patient states her doctor advised her to come to ER. Patient has hx of same and take meds (losartan). Headache for the past few weeks.

## 2022-08-10 NOTE — ED Provider Triage Note (Signed)
Emergency Medicine Provider Triage Evaluation Note  [name] , a 46 y.o. female  was evaluated in triage.  Pt complains of hypertension and headache.  Patient states that she has developed a sudden sharp headache today.  No unilateral weakness, slurred speech.  She took her blood pressure and it was elevated.  History of hypertension but typically well maintained on losartan.  Her systolic readings have been 50 to 70 mmHg higher than normal..  Review of Systems  Positive: Headache, hypertension Negative: Vision changes, unilateral weakness, slurred speech, chest pain  Physical Exam  BP (!) 162/107 (BP Location: Left Arm)   Pulse (!) 111   Temp 98.8 F (37.1 C) (Oral)   Resp 18   Ht 5\' 2"  (1.575 m)   Wt 97.5 kg   LMP 05/09/2006   SpO2 97%   BMI 39.32 kg/m  Gen:   Awake, no distress   Resp:  Normal effort  MSK:   Moves extremities without difficulty  Other:  No gross neurodeficits identified  Medical Decision Making  Medically screening exam initiated at 5:21 PM.  Appropriate orders placed.  [name] was informed that the remainder of the evaluation will be completed by another provider, this initial triage assessment does not replace that evaluation, and the importance of remaining in the ED until their evaluation is complete.  Patient presents to the ED with headache, hypertension.  Patient will have labs, CT scan   Stephens November, PA-C 08/10/22 1721

## 2022-08-10 NOTE — Telephone Encounter (Signed)
[facility] - Client TELEPHONE ADVICE RECORD AccessNurse Patient Name: [name] Gender: Female DOB: 11/13/1976 Age: 46 Y 6 M Return Phone Number: [phone] (Primary) Address: City/ State/ Zip: [place] [address] Client [facility] - Client Client Site [facility] - Day Provider [place], Idamae Schuller - MD Contact Type Call Who Is Calling Patient / Member / Family / Caregiver Call Type Triage / Clinical Relationship To Patient Self Return Phone Number [phone] (Primary) Chief Complaint Headache Reason for Call Symptomatic / Request for Health Information Initial Comment Caller from the office has a pt who states their blood pressure is elevated. She has a headache and neck pain. Translation No Nurse Assessment Nurse: Su Ley, RN, Joy Date/Time (Eastern Time): 08/10/2022 4:15:13 PM Confirm and document reason for call. If symptomatic, describe symptoms. ---Caller states that her blood pressure is elevated 165/143 wrist about 30 minutes ago. Another reading showed 159/137. States she took her blood pressure medications. States she has a headache and neck pain. Does the patient have any new or worsening symptoms? ---Yes Will a triage be completed? ---Yes Related visit to physician within the last 2 weeks? ---No Does the PT have any chronic conditions? (i.[name]. diabetes, asthma, this includes High risk factors for pregnancy, etc.) ---Yes List chronic conditions. ---HTN Is the patient pregnant or possibly pregnant? (Ask all females between the ages of 98-55) ---No Is this a behavioral health or substance abuse call? ---No Guidelines Guideline Title Affirmed Question Affirmed Notes Nurse Date/Time (Eastern Time) Blood Pressure - High [1] Systolic BP >= 160 OR Diastolic >= 100 AND [2] cardiac ([name].g., breathing difficulty, chest pain) or neurologic symptoms ([name].g., Su Ley, RN, Joy  08/10/2022 4:19:12 PM PLEASE NOTE: All timestamps contained within this report are represented as [place] Standard Time. CONFIDENTIALTY NOTICE: This fax transmission is intended only for the addressee. It contains information that is legally privileged, confidential or otherwise protected from use or disclosure. If you are not the intended recipient, you are strictly prohibited from reviewing, disclosing, copying using or disseminating any of this information or taking any action in reliance on or regarding this information. If you have received this fax in error, please notify us immediately by telephone so that we can arrange for its return to [place]. Phone: [phone], Toll-Free: [phone], Fax: [phone] Page: 2 of 2 Call Id: [id number] Guidelines Guideline Title Affirmed Question Affirmed Notes Nurse Date/Time Lamount Cohen Time) new-onset blurred or double vision, unsteady gait) Disp. Time Lamount Cohen Time) Disposition Final User 08/10/2022 4:25:33 PM Go to ED Now Yes Su Ley, RN, Joy Final Disposition 08/10/2022 4:25:33 PM Go to ED Now Yes Su Ley, RN, Joy Caller Disagree/Comply Comply Caller Understands Yes PreDisposition InappropriateToAsk Care Advice Given Per Guideline GO TO ED NOW: CARE ADVICE given per High Blood Pressure (Adult) guideline. CALL EMS 911 IF: * Passes out or faints * Becomes confused * Becomes too weak to stand * Another adult should drive. Referrals [facility] - [name]

## 2022-08-10 NOTE — ED Provider Notes (Signed)
[facility] Provider Note    Event Date/Time   First MD Initiated Contact with Patient 08/10/22 2214     (approximate)   History   Chief Complaint: Hypertension   HPI  [name] is a 46 y.o. female with a history of hypertension, diabetes, GERD who comes to the ED tonight due to elevated blood pressure.  She has been on losartan 50 mg daily for a long period of time, compliant with this regimen.  No changes in lifestyle although she does note that she eats out a lot.  No chest pain shortness of breath or abdominal pain.  She does complain of some headache which is bilateral occipital radiating up to bilateral frontal.  No vision changes paresthesias or weakness, no change in balance or coordination.  Gradual onset, not thunderclap.    Physical Exam   Triage Vital Signs: ED Triage Vitals  Enc Vitals Group     BP 08/10/22 1717 (!) 162/107     Pulse Rate 08/10/22 1717 (!) 111     Resp 08/10/22 1717 18     Temp 08/10/22 1717 98.8 F (37.1 C)     Temp Source 08/10/22 1717 Oral     SpO2 08/10/22 1717 97 %     Weight 08/10/22 1716 215 lb (97.5 kg)     Height 08/10/22 1716 5\' 2"  (1.575 m)     Head Circumference --      Peak Flow --      Pain Score 08/10/22 1716 7     Pain Loc --      Pain Edu? --      Excl. in GC? --     Most recent vital signs: Vitals:   08/10/22 2202 08/10/22 2230  BP:  (!) 169/85  Pulse:  71  Resp:    Temp: 98 F (36.7 C)   SpO2:  97%    General: Awake, no distress.  CV:  Good peripheral perfusion.  Regular rate and rhythm Resp:  Normal effort.  Clear to auscultation bilaterally Abd:  No distention.  Other:  Cranial nerves III through XII intact, PERRL, EOMI, no drift, normal cerebellar function   ED Results / Procedures / Treatments   Labs (all labs ordered are listed, but only abnormal results are displayed) Labs Reviewed  COMPREHENSIVE METABOLIC PANEL - Abnormal; Notable for the following  components:      Result Value   Potassium 3.4 (*)    Glucose, Bld 104 (*)    Total Bilirubin 1.3 (*)    All other components within normal limits  CBC WITH DIFFERENTIAL/PLATELET  TSH  URINALYSIS, ROUTINE W REFLEX MICROSCOPIC     EKG    RADIOLOGY CT head interpreted by me, appears normal.  Radiology report reviewed.   PROCEDURES:  Procedures   MEDICATIONS ORDERED IN ED: Medications  losartan (COZAAR) tablet 50 mg (50 mg Oral Given 08/10/22 2233)  cloNIDine (CATAPRES) tablet 0.1 mg (0.1 mg Oral Given 08/10/22 2233)     IMPRESSION / MDM / ASSESSMENT AND PLAN / ED COURSE  I reviewed the triage vital signs and the nursing notes.                              Differential diagnosis includes, but is not limited to, uncontrolled hypertension, AKI, electrolyte abnormality, hypothyroidism, tension headache  Patient's presentation is most consistent with acute presentation with potential threat to life or bodily function.  Patient presents with severely elevated blood pressure despite compliance with her antihypertensives.  She has headache but no focal neurodeficits.  No signs or symptoms of acute endorgan dysfunction or cardiac injury.  Labs are normal.  No straightforward adjustment of blood pressure management seems to be increasing losartan to 100 mg twice daily.  Patient given additional 50 mg losartan here along with 0.1 clonidine for overnight improvement.  She has an appointment with her PCP in the morning and prefers to discuss any new prescription with primary care.   Considering the patient's symptoms, medical history, and physical examination today, I have low suspicion for ischemic stroke, intracranial hemorrhage, meningitis, encephalitis, carotid or vertebral dissection, venous sinus thrombosis, MS, intracranial hypertension, glaucoma, CRAO, CRVO, or temporal arteritis.        FINAL CLINICAL IMPRESSION(S) / ED DIAGNOSES   Final diagnoses:  Hypertension,  unspecified type     Rx / DC Orders   ED Discharge Orders     None        Note:  This document was prepared using Dragon voice recognition software and may include unintentional dictation errors.   Sharman Cheek, MD 08/10/22 [phone]

## 2022-08-10 NOTE — Discharge Instructions (Addendum)
Your lab tests and CT scan of the head today were all okay.  Keep your appointment with your doctor tomorrow to adjust your blood pressure regimen as needed.    In the emergency department we gave you an additional 50 mg of losartan as well as 0.1 mg of clonidine to improve your blood pressure control overnight.  This may cause your blood pressure to appear well controlled during your primary care visit in the morning, but it is important to note that multiple blood pressure readings in the emergency department tonight showed blood pressures of approximately 170/90 or 180/100.

## 2022-08-11 ENCOUNTER — Ambulatory Visit (INDEPENDENT_AMBULATORY_CARE_PROVIDER_SITE_OTHER): Payer: Managed Care, Other (non HMO) | Admitting: Primary Care

## 2022-08-11 ENCOUNTER — Encounter: Payer: Self-pay | Admitting: Primary Care

## 2022-08-11 VITALS — BP 138/88 | HR 81 | Temp 98.6°F | Ht 62.0 in | Wt 213.0 lb

## 2022-08-11 DIAGNOSIS — Z23 Encounter for immunization: Secondary | ICD-10-CM | POA: Diagnosis not present

## 2022-08-11 DIAGNOSIS — I10 Essential (primary) hypertension: Secondary | ICD-10-CM | POA: Diagnosis not present

## 2022-08-11 MED ORDER — HYDROCHLOROTHIAZIDE 12.5 MG PO TABS: 12.5000 mg | ORAL_TABLET | Freq: Every day | ORAL | 0 refills | Status: DC

## 2022-08-11 NOTE — Progress Notes (Signed)
Subjective:    Patient ID: [name], female    DOB: [birth year], 46 y.o.   MRN: [id number]  Hypertension Associated symptoms include headaches. Pertinent negatives include no chest pain or shortness of breath.    [name] is a very pleasant 46 y.o. female patient of Dr. Milinda Antis with a history of hypertension, sleep apnea, prediabetes, obesity who presents today to discuss hypertension and for ED follow-up.  She presented to [facility] ED yesterday for elevated blood pressure readings and headaches despite management and compliance with losartan 50 mg daily.  Initial blood pressure was noted to be 162/107 with heart rate of 111.  During her ED visit work-up was grossly unremarkable including labs and CT head.  She was provided with an additional losartan 50 mg tablet and 0.1 mg of clonidine and discharged home with recommendations for PCP follow-up.  Today she endorses noticeable headaches over the last few weeks. Two days ago her headache increased, located to the bilateral occipital lobes with fatigue She completed a wellness screening that day with her employer, was told that her BP was 158/92.   She presented to [facility] yesterday, purchased a wrist BP cuff and saw readings of 170/148, 167/139, 159/137 which prompted her to contact our office.   She has been complaint to her losartan 50 mg daily. She decided to take an over the counter diuretic pill yesterday without improvement. She was once managed on lisinopril-HCTZ and HCTZ 12.5 mg at different times. Her HCTZ was discontinued years ago due to symptoms of dizziness. At the time she was exercising regularly. She does not exercise now.   BP Readings from Last 3 Encounters:  08/11/22 138/88  08/10/22 (!) 169/85  12/02/21 130/88      Review of Systems  Constitutional:  Positive for fatigue.  Eyes:  Negative for visual disturbance.  Respiratory:  Negative for shortness of breath.   Cardiovascular:  Negative for  chest pain.  Neurological:  Positive for headaches. Negative for dizziness.         Past Medical History:  Diagnosis Date   Abnormal Pap smear    cin-1   Allergic rhinitis    seasonal   Allergy    Diabetes mellitus without complication (HCC)    boarderline per pt   GERD (gastroesophageal reflux disease)    Herpes simplex without mention of complication    hsv-2   Hypertension     Social History   Socioeconomic History   Marital status: Single    Spouse name: Not on file   Number of children: Not on file   Years of education: Not on file   Highest education level: Not on file  Occupational History   Not on file  Tobacco Use   Smoking status: Never   Smokeless tobacco: Never  Vaping Use   Vaping Use: Never used  Substance and Sexual Activity   Alcohol use: Yes    Alcohol/week: 0.0 standard drinks of alcohol    Comment: social   Drug use: No   Sexual activity: Yes    Partners: Male    Birth control/protection: Condom, Surgical  Other Topics Concern   Not on file  Social History Narrative   Not on file   Social Determinants of Health   Financial Resource Strain: Not on file  Food Insecurity: Not on file  Transportation Needs: Not on file  Physical Activity: Not on file  Stress: Not on file  Social Connections: Not on file  Intimate Partner  Violence: Not on file    Past Surgical History:  Procedure Laterality Date   CERVIX LESION DESTRUCTION     COLPOSCOPY  1997   abnormal pap   LAMINECTOMY AND MICRODISCECTOMY LUMBAR SPINE  12/2007   L5-S1   PARTIAL HYSTERECTOMY  11/2005   bleeding   WISDOM TOOTH EXTRACTION      Family History  Problem Relation Age of Onset   Hypertension Mother 91   Hypertension Father    Diabetes Paternal Grandfather    Breast cancer Maternal Grandmother    Colon cancer Other        M.Great aunt   Stroke Paternal Grandmother    Stomach cancer Neg Hx    Pancreatic cancer Neg Hx    Esophageal cancer Neg Hx    Rectal  cancer Neg Hx     Allergies  Allergen Reactions   Lisinopril     Cough    Morphine     REACTION: skin crawling   Oxycodone Itching   Rofecoxib     REACTION: blurred vision, dizziness    Current Outpatient Medications on File Prior to Visit  Medication Sig Dispense Refill   fluticasone (FLONASE) 50 MCG/ACT nasal spray Place 2 sprays into both nostrils daily. 48 g 3   ketoconazole (NIZORAL) 2 % cream Apply 1 application topically 2 (two) times daily. 15 g 3   Multiple Vitamin (MULTIVITAMIN) capsule Take 1 capsule by mouth daily.     valACYclovir (VALTREX) 1000 MG tablet TAKE 1/2 TABLET  BY MOUTH 2 TIMES DAILY AS NEEDED. 90 tablet 1   losartan (COZAAR) 50 MG tablet Take 1 tablet (50 mg total) by mouth daily. Keep scheduled appointment on 12/02/2021 (Patient not taking: Reported on 08/11/2022) 90 tablet 3   No current facility-administered medications on file prior to visit.    BP 138/88   Pulse 81   Temp 98.6 F (37 C) (Oral)   Ht 5\' 2"  (1.575 m)   Wt 213 lb (96.6 kg)   LMP 05/09/2006   SpO2 96%   BMI 38.96 kg/m  Objective:   Physical Exam Cardiovascular:     Rate and Rhythm: Normal rate and regular rhythm.  Pulmonary:     Effort: Pulmonary effort is normal.     Breath sounds: Normal breath sounds.  Musculoskeletal:     Cervical back: Neck supple.  Skin:    General: Skin is warm and dry.           Assessment & Plan:   Problem List Items Addressed This Visit       Cardiovascular and Mediastinum   Essential hypertension - Primary    Improved today which is likely secondary to the additional losartan 50 mg and clonidine 0.1 mg from last night. Long discussion about options for treatment which included lifestyle changes and additional medication.'  We also discussed that a wrist blood pressure cuff may not produce the most accurate readings.  Continue losartan 50 mg daily. Add hydrochlorothiazide 12.5 mg daily.  I reviewed her prior labs well managed on  hydrochlorothiazide and do not see where she experienced renal or electrolyte abnormality.  She will follow-up with her PCP in 2 to 3 weeks for blood pressure check and BMP.  Hospital labs, notes, imaging reviewed.        Relevant Medications   hydrochlorothiazide (HYDRODIURIL) 12.5 MG tablet       05/11/2006, NP

## 2022-08-11 NOTE — Patient Instructions (Signed)
Continue taking losartan 50 mg once daily for blood pressure.  Start taking hydrochlorothiazide 12.5 mg once daily for blood pressure.  Continue to monitor your blood pressure as discussed.  Please schedule a follow-up visit with Dr. Milinda Antis for 2 weeks for blood pressure check.  It was a pleasure meeting you!  DASH Eating Plan DASH stands for Dietary Approaches to Stop Hypertension. The DASH eating plan is a healthy eating plan that has been shown to: Reduce high blood pressure (hypertension). Reduce your risk for type 2 diabetes, heart disease, and stroke. Help with weight loss. What are tips for following this plan? Reading food labels Check food labels for the amount of salt (sodium) per serving. Choose foods with less than 5 percent of the Daily Value of sodium. Generally, foods with less than 300 milligrams (mg) of sodium per serving fit into this eating plan. To find whole grains, look for the word "whole" as the first word in the ingredient list. Shopping Buy products labeled as "low-sodium" or "no salt added." Buy fresh foods. Avoid canned foods and pre-made or frozen meals. Cooking Avoid adding salt when cooking. Use salt-free seasonings or herbs instead of table salt or sea salt. Check with your health care provider or pharmacist before using salt substitutes. Do not fry foods. Cook foods using healthy methods such as baking, boiling, grilling, roasting, and broiling instead. Cook with heart-healthy oils, such as olive, canola, avocado, soybean, or sunflower oil. Meal planning  Eat a balanced diet that includes: 4 or more servings of fruits and 4 or more servings of vegetables each day. Try to fill one-half of your plate with fruits and vegetables. 6-8 servings of whole grains each day. Less than 6 oz (170 g) of lean meat, poultry, or fish each day. A 3-oz (85-g) serving of meat is about the same size as a deck of cards. One egg equals 1 oz (28 g). 2-3 servings of low-fat  dairy each day. One serving is 1 cup (237 mL). 1 serving of nuts, seeds, or beans 5 times each week. 2-3 servings of heart-healthy fats. Healthy fats called omega-3 fatty acids are found in foods such as walnuts, flaxseeds, fortified milks, and eggs. These fats are also found in cold-water fish, such as sardines, salmon, and mackerel. Limit how much you eat of: Canned or prepackaged foods. Food that is high in trans fat, such as some fried foods. Food that is high in saturated fat, such as fatty meat. Desserts and other sweets, sugary drinks, and other foods with added sugar. Full-fat dairy products. Do not salt foods before eating. Do not eat more than 4 egg yolks a week. Try to eat at least 2 vegetarian meals a week. Eat more home-cooked food and less restaurant, buffet, and fast food. Lifestyle When eating at a restaurant, ask that your food be prepared with less salt or no salt, if possible. If you drink alcohol: Limit how much you use to: 0-1 drink a day for women who are not pregnant. 0-2 drinks a day for men. Be aware of how much alcohol is in your drink. In the [place] one drink equals one 12 oz bottle of beer (355 mL), one 5 oz glass of wine (148 mL), or one 1 oz glass of hard liquor (44 mL). General information Avoid eating more than 2,300 mg of salt a day. If you have hypertension, you may need to reduce your sodium intake to 1,500 mg a day. Work with your health care provider to  maintain a healthy body weight or to lose weight. Ask what an ideal weight is for you. Get at least 30 minutes of exercise that causes your heart to beat faster (aerobic exercise) most days of the week. Activities may include walking, swimming, or biking. Work with your health care provider or dietitian to adjust your eating plan to your individual calorie needs. What foods should I eat? Fruits All fresh, dried, or frozen fruit. Canned fruit in natural juice (without added sugar). Vegetables Fresh or  frozen vegetables (raw, steamed, roasted, or grilled). Low-sodium or reduced-sodium tomato and vegetable juice. Low-sodium or reduced-sodium tomato sauce and tomato paste. Low-sodium or reduced-sodium canned vegetables. Grains Whole-grain or whole-wheat bread. Whole-grain or whole-wheat pasta. Brown rice. Orpah Cobb. Bulgur. Whole-grain and low-sodium cereals. Pita bread. Low-fat, low-sodium crackers. Whole-wheat flour tortillas. Meats and other proteins Skinless chicken or [place]. Ground chicken or [place]. Pork with fat trimmed off. Fish and seafood. Egg whites. Dried beans, peas, or lentils. Unsalted nuts, nut butters, and seeds. Unsalted canned beans. Lean cuts of beef with fat trimmed off. Low-sodium, lean precooked or cured meat, such as sausages or meat loaves. Dairy Low-fat (1%) or fat-free (skim) milk. Reduced-fat, low-fat, or fat-free cheeses. Nonfat, low-sodium ricotta or cottage cheese. Low-fat or nonfat yogurt. Low-fat, low-sodium cheese. Fats and oils Soft margarine without trans fats. Vegetable oil. Reduced-fat, low-fat, or light mayonnaise and salad dressings (reduced-sodium). Canola, safflower, olive, avocado, soybean, and sunflower oils. Avocado. Seasonings and condiments Herbs. Spices. Seasoning mixes without salt. Other foods Unsalted popcorn and pretzels. Fat-free sweets. The items listed above may not be a complete list of foods and beverages you can eat. Contact a dietitian for more information. What foods should I avoid? Fruits Canned fruit in a light or heavy syrup. Fried fruit. Fruit in cream or butter sauce. Vegetables Creamed or fried vegetables. Vegetables in a cheese sauce. Regular canned vegetables (not low-sodium or reduced-sodium). Regular canned tomato sauce and paste (not low-sodium or reduced-sodium). Regular tomato and vegetable juice (not low-sodium or reduced-sodium). Rosita Fire. Olives. Grains Baked goods made with fat, such as croissants, muffins, or some  breads. Dry pasta or rice meal packs. Meats and other proteins Fatty cuts of meat. Ribs. Fried meat. Tomasa Blase. Bologna, salami, and other precooked or cured meats, such as sausages or meat loaves. Fat from the back of a pig (fatback). Bratwurst. Salted nuts and seeds. Canned beans with added salt. Canned or smoked fish. Whole eggs or egg yolks. Chicken or [place] with skin. Dairy Whole or 2% milk, cream, and half-and-half. Whole or full-fat cream cheese. Whole-fat or sweetened yogurt. Full-fat cheese. Nondairy creamers. Whipped toppings. Processed cheese and cheese spreads. Fats and oils Butter. Stick margarine. Lard. Shortening. Ghee. Bacon fat. Tropical oils, such as coconut, palm kernel, or palm oil. Seasonings and condiments Onion salt, garlic salt, seasoned salt, table salt, and sea salt. Worcestershire sauce. Tartar sauce. Barbecue sauce. Teriyaki sauce. Soy sauce, including reduced-sodium. Steak sauce. Canned and packaged gravies. Fish sauce. Oyster sauce. Cocktail sauce. Store-bought horseradish. Ketchup. Mustard. Meat flavorings and tenderizers. Bouillon cubes. Hot sauces. Pre-made or packaged marinades. Pre-made or packaged taco seasonings. Relishes. Regular salad dressings. Other foods Salted popcorn and pretzels. The items listed above may not be a complete list of foods and beverages you should avoid. Contact a dietitian for more information. Where to find more information National Heart, Lung, and Blood Institute: PopSteam.is American Heart Association: www.heart.org Academy of Nutrition and Dietetics: www.eatright.org National Kidney Foundation: www.kidney.org Summary The DASH eating plan is  a healthy eating plan that has been shown to reduce high blood pressure (hypertension). It may also reduce your risk for type 2 diabetes, heart disease, and stroke. When on the DASH eating plan, aim to eat more fresh fruits and vegetables, whole grains, lean proteins, low-fat dairy, and  heart-healthy fats. With the DASH eating plan, you should limit salt (sodium) intake to 2,300 mg a day. If you have hypertension, you may need to reduce your sodium intake to 1,500 mg a day. Work with your health care provider or dietitian to adjust your eating plan to your individual calorie needs. This information is not intended to replace advice given to you by your health care provider. Make sure you discuss any questions you have with your health care provider. Document Revised: 11/14/2019 Document Reviewed: 11/14/2019 Elsevier Patient Education  2023 [employer].

## 2022-08-11 NOTE — Assessment & Plan Note (Addendum)
Improved today which is likely secondary to the additional losartan 50 mg and clonidine 0.1 mg from last night. Long discussion about options for treatment which included lifestyle changes and additional medication.'  We also discussed that a wrist blood pressure cuff may not produce the most accurate readings.  Continue losartan 50 mg daily. Add hydrochlorothiazide 12.5 mg daily.  I reviewed her prior labs well managed on hydrochlorothiazide and do not see where she experienced renal or electrolyte abnormality.  She will follow-up with her PCP in 2 to 3 weeks for blood pressure check and BMP.  Hospital labs, notes, imaging reviewed.

## 2022-08-25 ENCOUNTER — Encounter: Payer: Self-pay | Admitting: Family Medicine

## 2022-08-25 ENCOUNTER — Telehealth: Payer: Self-pay | Admitting: Family Medicine

## 2022-08-25 ENCOUNTER — Ambulatory Visit (INDEPENDENT_AMBULATORY_CARE_PROVIDER_SITE_OTHER): Payer: Managed Care, Other (non HMO) | Admitting: Family Medicine

## 2022-08-25 VITALS — BP 118/66 | HR 96 | Temp 98.0°F | Ht 62.0 in | Wt 213.4 lb

## 2022-08-25 DIAGNOSIS — G44209 Tension-type headache, unspecified, not intractable: Secondary | ICD-10-CM | POA: Diagnosis not present

## 2022-08-25 DIAGNOSIS — I10 Essential (primary) hypertension: Secondary | ICD-10-CM

## 2022-08-25 MED ORDER — METHOCARBAMOL 500 MG PO TABS: 500.0000 mg | ORAL_TABLET | Freq: Three times a day (TID) | ORAL | 1 refills | Status: DC | PRN

## 2022-08-25 NOTE — Progress Notes (Signed)
Subjective:    Patient ID: [name], female    DOB: 09-26-1976, 46 y.o.   MRN: [id number]  HPI Pt presents for f/u of HTN  Wt Readings from Last 3 Encounters:  08/25/22 213 lb 6 oz (96.8 kg)  08/11/22 213 lb (96.6 kg)  08/10/22 215 lb (97.5 kg)   39.03 kg/m  Pt saw NP Clark for elevated bp on 8/18 after ER visit were bp was high   She had unremarkable ER visit incl nl CT head and was px addn losartan 50 and clonodine 0.1 mg  She did c/o headache   Bp at last visit was improved   bp is stable today  No cp or palpitations or headaches or edema  No side effects to medicines  BP Readings from Last 3 Encounters:  08/25/22 118/66  08/11/22 138/88  08/10/22 (!) 169/85    Losartan 50 mg daily  Hctz 12.5 mg daily   Feels much better overall   Has changed her diet- less processed foods and take out   Not as much exercise lately due to problems with the headache  Also has some leg spasms since her back surgery   Still has headaches -not as bad   Typically both sides-at temples Lot of tension in neck and back of head  Had a massage - this helped  Big difference   Drinking water   Lab Results  Component Value Date   CREATININE 0.72 08/10/2022   BUN 9 08/10/2022   NA 136 08/10/2022   K 3.4 (L) 08/10/2022   CL 101 08/10/2022   CO2 24 08/10/2022   Lab Results  Component Value Date   HGBA1C 5.7 (H) 11/28/2021   Lab Results  Component Value Date   CHOL 165 11/28/2021   HDL 54 11/28/2021   LDLCALC 94 11/28/2021   TRIG 90 11/28/2021   CHOLHDL 3.1 11/28/2021    Patient Active Problem List   Diagnosis Date Noted   Tension headache 08/25/2022   Tinea pedis 12/01/2020   Obesity (BMI 30-39.9) 12/03/2019   Hematuria, microscopic 03/04/2019   Prediabetes 11/27/2017   Snoring 11/24/2016   Screening mammogram, encounter for 11/24/2016   Sleep apnea 01/03/2016   Encounter for general adult medical examination with abnormal findings 09/26/2013    Essential hypertension 09/16/2012   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   Allergic rhinitis 07/02/2007   GERD 07/02/2007   Past Medical History:  Diagnosis Date   Abnormal Pap smear    cin-1   Allergic rhinitis    seasonal   Allergy    Diabetes mellitus without complication (HCC)    boarderline per pt   GERD (gastroesophageal reflux disease)    Herpes simplex without mention of complication    hsv-2   Hypertension    Past Surgical History:  Procedure Laterality Date   CERVIX LESION DESTRUCTION     COLPOSCOPY  1997   abnormal pap   LAMINECTOMY AND MICRODISCECTOMY LUMBAR SPINE  12/2007   L5-S1   PARTIAL HYSTERECTOMY  11/2005   bleeding   WISDOM TOOTH EXTRACTION     Social History   Tobacco Use   Smoking status: Never   Smokeless tobacco: Never  Vaping Use   Vaping Use: Never used  Substance Use Topics   Alcohol use: Yes    Alcohol/week: 0.0 standard drinks of alcohol    Comment: social   Drug use: No   Family History  Problem Relation Age of Onset   Hypertension Mother 77  Hypertension Father    Diabetes Paternal Grandfather    Breast cancer Maternal Grandmother    Colon cancer Other        M.Great aunt   Stroke Paternal Grandmother    Stomach cancer Neg Hx    Pancreatic cancer Neg Hx    Esophageal cancer Neg Hx    Rectal cancer Neg Hx    Allergies  Allergen Reactions   Lisinopril     Cough    Morphine     REACTION: skin crawling   Oxycodone Itching   Rofecoxib     REACTION: blurred vision, dizziness   Current Outpatient Medications on File Prior to Visit  Medication Sig Dispense Refill   fluticasone (FLONASE) 50 MCG/ACT nasal spray Place 2 sprays into both nostrils daily. 48 g 3   hydrochlorothiazide (HYDRODIURIL) 12.5 MG tablet Take 1 tablet (12.5 mg total) by mouth daily. for blood pressure. 30 tablet 0   ketoconazole (NIZORAL) 2 % cream Apply 1 application topically 2 (two) times daily. 15 g 3   losartan (COZAAR) 50 MG tablet Take 1 tablet (50  mg total) by mouth daily. Keep scheduled appointment on 12/02/2021 90 tablet 3   Multiple Vitamin (MULTIVITAMIN) capsule Take 1 capsule by mouth daily.     valACYclovir (VALTREX) 1000 MG tablet TAKE 1/2 TABLET  BY MOUTH 2 TIMES DAILY AS NEEDED. 90 tablet 1   No current facility-administered medications on file prior to visit.     Review of Systems  Constitutional:  Negative for activity change, appetite change, fatigue, fever and unexpected weight change.  HENT:  Negative for congestion, ear pain, rhinorrhea, sinus pressure and sore throat.   Eyes:  Negative for pain, redness and visual disturbance.  Respiratory:  Negative for cough, shortness of breath and wheezing.   Cardiovascular:  Negative for chest pain and palpitations.       Felt more swollen before hctz, thinks it is helping   Gastrointestinal:  Negative for abdominal pain, blood in stool, constipation and diarrhea.  Endocrine: Negative for polydipsia and polyuria.  Genitourinary:  Negative for dysuria, frequency and urgency.  Musculoskeletal:  Negative for arthralgias, back pain and myalgias.  Skin:  Negative for pallor and rash.  Allergic/Immunologic: Negative for environmental allergies.  Neurological:  Negative for dizziness, syncope and headaches.  Hematological:  Negative for adenopathy. Does not bruise/bleed easily.  Psychiatric/Behavioral:  Negative for decreased concentration and dysphoric mood. The patient is not nervous/anxious.        Objective:   Physical Exam Constitutional:      General: She is not in acute distress.    Appearance: Normal appearance. She is well-developed. She is obese. She is not ill-appearing or diaphoretic.  HENT:     Head: Normocephalic and atraumatic.     Mouth/Throat:     Mouth: Mucous membranes are moist.  Eyes:     General: No scleral icterus.    Conjunctiva/sclera: Conjunctivae normal.     Pupils: Pupils are equal, round, and reactive to light.  Neck:     Thyroid: No  thyromegaly.     Vascular: No carotid bruit or JVD.  Cardiovascular:     Rate and Rhythm: Normal rate and regular rhythm.     Heart sounds: Normal heart sounds.     No gallop.  Pulmonary:     Effort: Pulmonary effort is normal. No respiratory distress.     Breath sounds: Normal breath sounds. No wheezing or rales.  Abdominal:     General: There is  no distension or abdominal bruit.     Palpations: Abdomen is soft.  Musculoskeletal:     Cervical back: Normal range of motion and neck supple.     Right lower leg: No edema.     Left lower leg: No edema.  Lymphadenopathy:     Cervical: No cervical adenopathy.  Skin:    General: Skin is warm and dry.     Coloration: Skin is not pale.     Findings: No rash.  Neurological:     Mental Status: She is alert.     Cranial Nerves: No cranial nerve deficit, dysarthria or facial asymmetry.     Motor: No weakness or tremor.     Coordination: Coordination normal.     Gait: Gait normal.     Deep Tendon Reflexes: Reflexes are normal and symmetric. Reflexes normal.  Psychiatric:        Mood and Affect: Mood normal.           Assessment & Plan:   Problem List Items Addressed This Visit       Cardiovascular and Mediastinum   Essential hypertension - Primary    Improved bp  bp in fair control at this time  BP Readings from Last 1 Encounters:  08/25/22 118/66   Reviewed ER visit 8/18 Reviewed hospital records, lab results and studies in detail  Reviewed note and plan from NP Clark No changes needed Most recent labs reviewed  Disc lifstyle change with low sodium diet and exercise  Plan to continue losartan 50 mg daily  hctz 12.5 mg daily  Lab today for bmet  When this returns will likely refill both of above meds Commended pt for lifestyle change       Relevant Orders   Basic metabolic panel     Other   Tension headache    Headache was worse when bp was high  Now less severe but persistent  Nl exam/no neuro changes   Suspect analgesic rebound from the tylenol inst to drink fluids  Px methocarbamol for neck spasm/tension and headache (caution of sedation)  Will continue massage Consider prednisone if no improvement  ER precautions noted  She continues to avoid caffeine       Relevant Medications   methocarbamol (ROBAXIN) 500 MG tablet

## 2022-08-25 NOTE — Patient Instructions (Addendum)
Stop tylenol   Drink lots of fluids  An occ ibuprofen for headache 2 pills is ok   Let's try a muscle relaxer for neck and headache  Watch for sedation with methocarbamol  Use flonase  During season - zyrtec or claritin helps also   Labs today   Try to get most of your carbohydrates from produce (with the exception of white potatoes)  Eat less bread/pasta/rice/snack foods/cereals/sweets and other items from the middle of the grocery store (processed carbs)  Stay away from processed/ salty stuff   Look up some yoga videos on line   Update if not starting to improve in a week or if worsening   We can consider some prednisone if the headache does not improve

## 2022-08-25 NOTE — Addendum Note (Signed)
Addended by: Alvina Chou on: 08/25/2022 09:49 AM   Modules accepted: Orders

## 2022-08-25 NOTE — Telephone Encounter (Signed)
Set a reminder in Epic for me and PCP to have labs ordered in Nov

## 2022-08-25 NOTE — Telephone Encounter (Signed)
Please send this back to me in [place] early now

## 2022-08-25 NOTE — Telephone Encounter (Signed)
Patient is scheduled for physical on 12/12,she does her labs at [facility] where she works,she would like the orders to be sent over there for her labs.

## 2022-08-25 NOTE — Assessment & Plan Note (Addendum)
Headache was worse when bp was high  Now less severe but persistent  Nl exam/no neuro changes  Suspect analgesic rebound from the tylenol inst to drink fluids  Px methocarbamol for neck spasm/tension and headache (caution of sedation)  Will continue massage Consider prednisone if no improvement  ER precautions noted  She continues to avoid caffeine

## 2022-08-25 NOTE — Assessment & Plan Note (Addendum)
Improved bp  bp in fair control at this time  BP Readings from Last 1 Encounters:  08/25/22 118/66   Reviewed ER visit 8/18 Reviewed hospital records, lab results and studies in detail  Reviewed note and plan from NP Clark No changes needed Most recent labs reviewed  Disc lifstyle change with low sodium diet and exercise  Plan to continue losartan 50 mg daily  hctz 12.5 mg daily  Lab today for bmet  When this returns will likely refill both of above meds Commended pt for lifestyle change

## 2022-08-26 LAB — BASIC METABOLIC PANEL
BUN/Creatinine Ratio: 15 (ref 9–23)
BUN: 12 mg/dL (ref 6–24)
CO2: 22 mmol/L (ref 20–29)
Calcium: 9.1 mg/dL (ref 8.7–10.2)
Chloride: 101 mmol/L (ref 96–106)
Creatinine, Ser: 0.78 mg/dL (ref 0.57–1.00)
Glucose: 102 mg/dL — ABNORMAL HIGH (ref 70–99)
Potassium: 4.2 mmol/L (ref 3.5–5.2)
Sodium: 138 mmol/L (ref 134–144)
eGFR: 95 mL/min/{1.73_m2} (ref >59)

## 2022-08-29 ENCOUNTER — Other Ambulatory Visit: Payer: Self-pay | Admitting: Primary Care

## 2022-08-29 DIAGNOSIS — I10 Essential (primary) hypertension: Secondary | ICD-10-CM

## 2022-08-30 ENCOUNTER — Encounter: Payer: Self-pay | Admitting: *Deleted

## 2022-08-30 MED ORDER — LOSARTAN POTASSIUM 50 MG PO TABS: 50.0000 mg | ORAL_TABLET | Freq: Every day | ORAL | 3 refills | Status: DC

## 2022-08-30 NOTE — Telephone Encounter (Signed)
-----   Message from Judy Pimple, MD sent at 08/28/2022  1:27 AM EDT ----- Labs look good Continue current medicines  Please refill her losartan and hctz for the year

## 2022-10-06 ENCOUNTER — Other Ambulatory Visit: Payer: Self-pay | Admitting: *Deleted

## 2022-10-06 DIAGNOSIS — I10 Essential (primary) hypertension: Secondary | ICD-10-CM

## 2022-10-06 MED ORDER — HYDROCHLOROTHIAZIDE 12.5 MG PO TABS: 12.5000 mg | ORAL_TABLET | Freq: Every day | ORAL | 2 refills | Status: DC

## 2022-11-07 ENCOUNTER — Telehealth: Payer: Self-pay | Admitting: Family Medicine

## 2022-11-07 ENCOUNTER — Ambulatory Visit (INDEPENDENT_AMBULATORY_CARE_PROVIDER_SITE_OTHER): Payer: Managed Care, Other (non HMO) | Admitting: Family Medicine

## 2022-11-07 ENCOUNTER — Encounter: Payer: Self-pay | Admitting: Family Medicine

## 2022-11-07 VITALS — BP 122/80 | HR 84 | Temp 98.9°F | Ht 62.0 in | Wt 215.4 lb

## 2022-11-07 DIAGNOSIS — J0110 Acute frontal sinusitis, unspecified: Secondary | ICD-10-CM

## 2022-11-07 DIAGNOSIS — J3089 Other allergic rhinitis: Secondary | ICD-10-CM

## 2022-11-07 DIAGNOSIS — I10 Essential (primary) hypertension: Secondary | ICD-10-CM

## 2022-11-07 DIAGNOSIS — R7303 Prediabetes: Secondary | ICD-10-CM

## 2022-11-07 MED ORDER — AMOXICILLIN-POT CLAVULANATE 875-125 MG PO TABS: 1.0000 | ORAL_TABLET | Freq: Two times a day (BID) | ORAL | 0 refills | Status: DC

## 2022-11-07 MED ORDER — PREDNISONE 10 MG PO TABS: ORAL_TABLET | ORAL | 0 refills | Status: DC

## 2022-11-07 NOTE — Patient Instructions (Addendum)
Stop your fast acting nasal spray (do not go back to it)   Use flonase daily  Use nasal saline spray (or a netti pot)  You will have some congestion for a while   Take the augmentin for sinus infection  Continue probiotic  Let us know if you get a yeast infection  Take prednisone as directed (it may make you feel hyper and hungry)   Update if not starting to improve in a week or if worsening     In season for allergies I recommend zyrtec 10 mg daily and flonase every day

## 2022-11-07 NOTE — Assessment & Plan Note (Signed)
In season I recommend both zyrtec and flonase daily

## 2022-11-07 NOTE — Progress Notes (Signed)
Subjective:    Patient ID: [name], female    DOB: 08-01-1976, 46 y.o.   MRN: [id number]  HPI Pt presents for sinus symptoms   BP Readings from Last 3 Encounters:  11/07/22 122/80  08/25/22 118/66  08/11/22 138/88    Had a flare in sept - had a cold  Got better and the worse   Flared up again this week  Some blood in nose   Ears are clogged /esp on L  Not painful, more pressure   Face hurts  Worse in between eyes  Mucous is green /yellow to clear   No fever  Did a covid test and it was negative   No ST   Cough is on/off  if she takes something  Phlegm    Nyquil Mucinex  12 hour decongestant spray     Fall allergies are worse this year   Patient Active Problem List   Diagnosis Date Noted   Tension headache 08/25/2022   Tinea pedis 12/01/2020   Obesity (BMI 30-39.9) 12/03/2019   Hematuria, microscopic 03/04/2019   Prediabetes 11/27/2017   Snoring 11/24/2016   Screening mammogram, encounter for 11/24/2016   Sleep apnea 01/03/2016   Encounter for general adult medical examination with abnormal findings 09/26/2013   Essential hypertension 09/16/2012   Sinusitis 09/07/2008   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   Allergic rhinitis 07/02/2007   GERD 07/02/2007   Past Medical History:  Diagnosis Date   Abnormal Pap smear    cin-1   Allergic rhinitis    seasonal   Allergy    Diabetes mellitus without complication (HCC)    boarderline per pt   GERD (gastroesophageal reflux disease)    Herpes simplex without mention of complication    hsv-2   Hypertension    Past Surgical History:  Procedure Laterality Date   CERVIX LESION DESTRUCTION     COLPOSCOPY  1997   abnormal pap   LAMINECTOMY AND MICRODISCECTOMY LUMBAR SPINE  12/2007   L5-S1   PARTIAL HYSTERECTOMY  11/2005   bleeding   WISDOM TOOTH EXTRACTION     Social History   Tobacco Use   Smoking status: Never   Smokeless tobacco: Never  Vaping Use   Vaping Use: Never used   Substance Use Topics   Alcohol use: Yes    Alcohol/week: 0.0 standard drinks of alcohol    Comment: social   Drug use: No   Family History  Problem Relation Age of Onset   Hypertension Mother 61   Hypertension Father    Diabetes Paternal Grandfather    Breast cancer Maternal Grandmother    Colon cancer Other        M.Great aunt   Stroke Paternal Grandmother    Stomach cancer Neg Hx    Pancreatic cancer Neg Hx    Esophageal cancer Neg Hx    Rectal cancer Neg Hx    Allergies  Allergen Reactions   Lisinopril     Cough    Morphine     REACTION: skin crawling   Oxycodone Itching   Rofecoxib     REACTION: blurred vision, dizziness   Current Outpatient Medications on File Prior to Visit  Medication Sig Dispense Refill   fluticasone (FLONASE) 50 MCG/ACT nasal spray Place 2 sprays into both nostrils daily. 48 g 3   hydrochlorothiazide (HYDRODIURIL) 12.5 MG tablet Take 1 tablet (12.5 mg total) by mouth daily. for blood pressure. 90 tablet 2   ketoconazole (NIZORAL) 2 % cream Apply  1 application topically 2 (two) times daily. 15 g 3   losartan (COZAAR) 50 MG tablet Take 1 tablet (50 mg total) by mouth daily. 90 tablet 3   Multiple Vitamin (MULTIVITAMIN) capsule Take 1 capsule by mouth daily.     valACYclovir (VALTREX) 1000 MG tablet TAKE 1/2 TABLET  BY MOUTH 2 TIMES DAILY AS NEEDED. 90 tablet 1   methocarbamol (ROBAXIN) 500 MG tablet Take 1 tablet (500 mg total) by mouth every 8 (eight) hours as needed for muscle spasms (neck pain and headache). Caution of sedation (Patient not taking: Reported on 11/07/2022) 30 tablet 1   No current facility-administered medications on file prior to visit.      Review of Systems  Constitutional:  Negative for activity change, appetite change, fatigue, fever and unexpected weight change.  HENT:  Positive for postnasal drip, rhinorrhea, sinus pressure and sinus pain. Negative for congestion, ear pain, nosebleeds and sore throat.   Eyes:   Negative for pain, redness, itching and visual disturbance.  Respiratory:  Negative for cough, shortness of breath and wheezing.   Cardiovascular:  Negative for chest pain and palpitations.  Gastrointestinal:  Negative for abdominal pain, blood in stool, constipation, diarrhea, nausea and vomiting.  Endocrine: Negative for polydipsia and polyuria.  Genitourinary:  Negative for dysuria, frequency and urgency.  Musculoskeletal:  Negative for arthralgias, back pain and myalgias.  Skin:  Negative for pallor and rash.  Allergic/Immunologic: Negative for environmental allergies and immunocompromised state.  Neurological:  Negative for dizziness, tremors, syncope, weakness, numbness and headaches.  Hematological:  Negative for adenopathy. Does not bruise/bleed easily.  Psychiatric/Behavioral:  Negative for decreased concentration and dysphoric mood. The patient is not nervous/anxious.        Objective:   Physical Exam Constitutional:      General: She is not in acute distress.    Appearance: Normal appearance. She is well-developed. She is obese. She is not ill-appearing.  HENT:     Head: Normocephalic and atraumatic.     Comments: Nares are injected and congested  Clear pnd   Bilateral frontal and ethmoid sinus tenderness     Right Ear: Tympanic membrane and external ear normal.     Left Ear: Tympanic membrane, ear canal and external ear normal.     Nose: Congestion and rhinorrhea present.     Mouth/Throat:     Mouth: Mucous membranes are moist.     Pharynx: No oropharyngeal exudate or posterior oropharyngeal erythema.     Comments: Clear pnd Eyes:     General:        Right eye: No discharge.        Left eye: No discharge.     Conjunctiva/sclera: Conjunctivae normal.     Pupils: Pupils are equal, round, and reactive to light.  Cardiovascular:     Rate and Rhythm: Normal rate and regular rhythm.  Pulmonary:     Effort: Pulmonary effort is normal. No respiratory distress.      Breath sounds: Normal breath sounds. No stridor. No wheezing, rhonchi or rales.     Comments: Good air exch  Musculoskeletal:     Cervical back: Normal range of motion and neck supple.  Lymphadenopathy:     Cervical: No cervical adenopathy.  Skin:    General: Skin is warm and dry.     Findings: No rash.  Neurological:     Mental Status: She is alert.     Cranial Nerves: No cranial nerve deficit.  Psychiatric:  Mood and Affect: Mood normal.           Assessment & Plan:   Problem List Items Addressed This Visit       Respiratory   Allergic rhinitis    In season I recommend both zyrtec and flonase daily      Sinusitis - Primary    Almost a month of symptoms  May have some rebound congestion from a fast acting nasal decongestant spray   Px augmentin  Prednisone 30 mg taper for congestion  Fluids/rest Nasal saline spray  Update if not starting to improve in a week or if worsening    For allergy season zyrtec and flonase daily in the future       Relevant Medications   predniSONE (DELTASONE) 10 MG tablet   amoxicillin-clavulanate (AUGMENTIN) 875-125 MG tablet

## 2022-11-07 NOTE — Assessment & Plan Note (Signed)
Almost a month of symptoms  May have some rebound congestion from a fast acting nasal decongestant spray   Px augmentin  Prednisone 30 mg taper for congestion  Fluids/rest Nasal saline spray  Update if not starting to improve in a week or if worsening    For allergy season zyrtec and flonase daily in the future

## 2022-11-07 NOTE — Telephone Encounter (Signed)
The orders are in  Thanks

## 2022-11-07 NOTE — Telephone Encounter (Signed)
CPE is on 12/05/22, please order labs for [facility] draw and I will release them to their sxs

## 2022-11-07 NOTE — Telephone Encounter (Signed)
Pt is requesting to have her physical labs done at [facility] .  Please advise

## 2022-11-08 ENCOUNTER — Ambulatory Visit: Payer: Managed Care, Other (non HMO) | Admitting: Family Medicine

## 2022-11-08 NOTE — Telephone Encounter (Signed)
Orders released and pt notified and copy of orders sent to pt also

## 2022-12-02 LAB — HEMOGLOBIN A1C
Est. average glucose Bld gHb Est-mCnc: 126 mg/dL
Hgb A1c MFr Bld: 6 % — ABNORMAL HIGH (ref 4.8–5.6)

## 2022-12-02 LAB — CBC WITH DIFFERENTIAL/PLATELET
Basophils Absolute: 0 10*3/uL (ref 0.0–0.2)
Basos: 1 %
EOS (ABSOLUTE): 0.2 10*3/uL (ref 0.0–0.4)
Eos: 3 %
Hematocrit: 39.1 % (ref 34.0–46.6)
Hemoglobin: 12.9 g/dL (ref 11.1–15.9)
Immature Grans (Abs): 0 10*3/uL (ref 0.0–0.1)
Immature Granulocytes: 0 %
Lymphocytes Absolute: 1.6 10*3/uL (ref 0.7–3.1)
Lymphs: 28 %
MCH: 29.1 pg (ref 26.6–33.0)
MCHC: 33 g/dL (ref 31.5–35.7)
MCV: 88 fL (ref 79–97)
Monocytes Absolute: 0.4 10*3/uL (ref 0.1–0.9)
Monocytes: 8 %
Neutrophils Absolute: 3.4 10*3/uL (ref 1.4–7.0)
Neutrophils: 60 %
Platelets: 246 10*3/uL (ref 150–450)
RBC: 4.43 x10E6/uL (ref 3.77–5.28)
RDW: 13.1 % (ref 11.7–15.4)
WBC: 5.6 10*3/uL (ref 3.4–10.8)

## 2022-12-02 LAB — COMPREHENSIVE METABOLIC PANEL
ALT: 19 IU/L (ref 0–32)
AST: 19 IU/L (ref 0–40)
Albumin/Globulin Ratio: 1.9 (ref 1.2–2.2)
Albumin: 4.3 g/dL (ref 3.9–4.9)
Alkaline Phosphatase: 63 IU/L (ref 44–121)
BUN/Creatinine Ratio: 11 (ref 9–23)
BUN: 9 mg/dL (ref 6–24)
Bilirubin Total: 0.7 mg/dL (ref 0.0–1.2)
CO2: 22 mmol/L (ref 20–29)
Calcium: 9.2 mg/dL (ref 8.7–10.2)
Chloride: 99 mmol/L (ref 96–106)
Creatinine, Ser: 0.79 mg/dL (ref 0.57–1.00)
Globulin, Total: 2.3 g/dL (ref 1.5–4.5)
Glucose: 122 mg/dL — ABNORMAL HIGH (ref 70–99)
Potassium: 3.8 mmol/L (ref 3.5–5.2)
Sodium: 137 mmol/L (ref 134–144)
Total Protein: 6.6 g/dL (ref 6.0–8.5)
eGFR: 93 mL/min/{1.73_m2} (ref >59)

## 2022-12-02 LAB — LIPID PANEL
Chol/HDL Ratio: 2.8 ratio (ref 0.0–4.4)
Cholesterol, Total: 169 mg/dL (ref 100–199)
HDL: 61 mg/dL (ref >39)
LDL Chol Calc (NIH): 85 mg/dL (ref 0–99)
Triglycerides: 135 mg/dL (ref 0–149)
VLDL Cholesterol Cal: 23 mg/dL (ref 5–40)

## 2022-12-02 LAB — TSH: TSH: 2.3 u[IU]/mL (ref 0.450–4.500)

## 2022-12-05 ENCOUNTER — Ambulatory Visit (INDEPENDENT_AMBULATORY_CARE_PROVIDER_SITE_OTHER): Payer: Managed Care, Other (non HMO) | Admitting: Family Medicine

## 2022-12-05 ENCOUNTER — Encounter: Payer: Self-pay | Admitting: Family Medicine

## 2022-12-05 VITALS — BP 138/85 | HR 83 | Temp 98.1°F | Ht 63.0 in | Wt 219.0 lb

## 2022-12-05 DIAGNOSIS — Z0000 Encounter for general adult medical examination without abnormal findings: Secondary | ICD-10-CM | POA: Diagnosis not present

## 2022-12-05 DIAGNOSIS — B351 Tinea unguium: Secondary | ICD-10-CM

## 2022-12-05 DIAGNOSIS — Z1231 Encounter for screening mammogram for malignant neoplasm of breast: Secondary | ICD-10-CM

## 2022-12-05 DIAGNOSIS — R252 Cramp and spasm: Secondary | ICD-10-CM

## 2022-12-05 DIAGNOSIS — I10 Essential (primary) hypertension: Secondary | ICD-10-CM

## 2022-12-05 DIAGNOSIS — G44209 Tension-type headache, unspecified, not intractable: Secondary | ICD-10-CM

## 2022-12-05 DIAGNOSIS — R7303 Prediabetes: Secondary | ICD-10-CM

## 2022-12-05 DIAGNOSIS — E669 Obesity, unspecified: Secondary | ICD-10-CM | POA: Diagnosis not present

## 2022-12-05 MED ORDER — HYDROCHLOROTHIAZIDE 12.5 MG PO TABS: 12.5000 mg | ORAL_TABLET | Freq: Every day | ORAL | 3 refills | Status: DC

## 2022-12-05 MED ORDER — LOSARTAN POTASSIUM 50 MG PO TABS: 50.0000 mg | ORAL_TABLET | Freq: Every day | ORAL | 3 refills | Status: DC

## 2022-12-05 NOTE — Assessment & Plan Note (Signed)
Headache at temples today  ? If migraine variant or tension  Reassuring exam  Bp better on 2nd check   Disc imp of fluids Control caffeine intake  Inst to take her methocarbamol today and try dose of ibuprofen if needed  Update if not starting to improve in a week or if worsening

## 2022-12-05 NOTE — Assessment & Plan Note (Signed)
Discussed how this problem influences overall health and the risks it imposes  Reviewed plan for weight loss with lower calorie diet (via better food choices and also portion control or program like weight watchers) and exercise building up to or more than 30 minutes 5 days per week including some aerobic activity   Enc to eat less fast food Get back to mood prep  Make plan for exercise /will have to schedule around work

## 2022-12-05 NOTE — Assessment & Plan Note (Signed)
Recurrent RLE cramp (calf) ever since her back surgery  This was the affected side with disc problem  Notes she tends to cramp when she points her toe   Enc use of heat/massage and stretching  Proper shoes/no heels or wedges if this flares things   Nl exam

## 2022-12-05 NOTE — Assessment & Plan Note (Signed)
Lab Results  Component Value Date   HGBA1C 6.0 (H) 12/01/2022   This is up  disc imp of low glycemic diet and wt loss to prevent DM2  Enc her to get out of habit of fast food/eating out

## 2022-12-05 NOTE — Progress Notes (Signed)
Subjective:    Patient ID: [name], female    DOB: 07/08/1976, 46 y.o.   MRN: [id number]  HPI Here for health maintenance exam and to review chronic medical problems    Wt Readings from Last 3 Encounters:  12/05/22 219 lb (99.3 kg)  11/07/22 215 lb 6.4 oz (97.7 kg)  08/25/22 213 lb 6 oz (96.8 kg)   38.79 kg/m  Has a headache today and bp is up  Started last night  Drinking fluids  Some ice coffee in the am every day   Has a lot of spasms in her R leg  Is careful not to point her toe too much  This is the side she had sciatic damage after her surgery   Makes it hard to exercise  She tries to massage it  Has used a trainer in the past     Immunization History  Administered Date(s) Administered   Influenza,inj,Quad PF,6+ Mos 01/03/2016, 11/24/2016, 10/12/2021, 08/11/2022   Influenza-Unspecified 09/25/2014   PFIZER Comirnaty(Gray Top)Covid-19 Tri-Sucrose Vaccine 12/02/2020, 12/23/2020   Td 12/26/1991, 12/04/2007, 12/05/2007   Tdap 11/27/2017   Health Maintenance Due  Topic Date Due   Hepatitis C Screening  Never done   MAMMOGRAM  01/17/2022   COVID-19 Vaccine (3 - 2023-24 season) 08/25/2022   PAP SMEAR-Modifier  01/07/2023   Mammogram 11/2021 Does at gyn office next month  Self breast exam : no lumps   Pap 12/2019 - will have a visit in January  Has had a hysterectomy   Colonoscopy 03/2019- 10 years  Her GI system is much better now   HTN bp is stable today  No cp or palpitations or headaches or edema  No side effects to medicines  BP Readings from Last 3 Encounters:  12/05/22 138/85  11/07/22 122/80  08/25/22 118/66    Losartan 50 mg daily  Hctz 12.5 ng daily   Lab Results  Component Value Date   CREATININE 0.79 12/01/2022   BUN 9 12/01/2022   NA 137 12/01/2022   K 3.8 12/01/2022   CL 99 12/01/2022   CO2 22 12/01/2022   GFR is nl   Lipids Lab Results  Component Value Date   CHOL 169 12/01/2022   CHOL 165 11/28/2021    CHOL 177 11/26/2020   Lab Results  Component Value Date   HDL 61 12/01/2022   HDL 54 11/28/2021   HDL 65 11/26/2020   Lab Results  Component Value Date   LDLCALC 85 12/01/2022   LDLCALC 94 11/28/2021   LDLCALC 96 11/26/2020   Lab Results  Component Value Date   TRIG 135 12/01/2022   TRIG 90 11/28/2021   TRIG 87 11/26/2020   Lab Results  Component Value Date   CHOLHDL 2.8 12/01/2022   CHOLHDL 3.1 11/28/2021   CHOLHDL 2.7 11/26/2020   No results found for: "LDLDIRECT" Good /stable cholesterol profile   Prediabetes Lab Results  Component Value Date   HGBA1C 6.0 (H) 12/01/2022  Up from 5.7   Diet is fair  Does not always eat breakfast  Lunch -  Dinner-eats out too much / esp with work for convenience (works late a lot)  Lot of [employer]  Wants to get back to meal prepping    Patient Active Problem List   Diagnosis Date Noted   Routine general medical examination at a health care facility 12/05/2022   Onychomycosis 12/05/2022   Tension headache 08/25/2022   Tinea pedis 12/01/2020   Obesity (BMI 30-39.9)  12/03/2019   Hematuria, microscopic 03/04/2019   Prediabetes 11/27/2017   Screening mammogram, encounter for 11/24/2016   Sleep apnea 01/03/2016   Encounter for general adult medical examination with abnormal findings 09/26/2013   Essential hypertension 09/16/2012   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   Allergic rhinitis 07/02/2007   GERD 07/02/2007   Past Medical History:  Diagnosis Date   Abnormal Pap smear    cin-1   Allergic rhinitis    seasonal   Allergy    Diabetes mellitus without complication (HCC)    boarderline per pt   GERD (gastroesophageal reflux disease)    Herpes simplex without mention of complication    hsv-2   Hypertension    Past Surgical History:  Procedure Laterality Date   CERVIX LESION DESTRUCTION     COLPOSCOPY  1997   abnormal pap   LAMINECTOMY AND MICRODISCECTOMY LUMBAR SPINE  12/2007   L5-S1   PARTIAL HYSTERECTOMY   11/2005   bleeding   WISDOM TOOTH EXTRACTION     Social History   Tobacco Use   Smoking status: Never   Smokeless tobacco: Never  Vaping Use   Vaping Use: Never used  Substance Use Topics   Alcohol use: Yes    Alcohol/week: 0.0 standard drinks of alcohol    Comment: social   Drug use: No   Family History  Problem Relation Age of Onset   Hypertension Mother 88   Hypertension Father    Diabetes Paternal Grandfather    Breast cancer Maternal Grandmother    Colon cancer Other        M.Great aunt   Stroke Paternal Grandmother    Stomach cancer Neg Hx    Pancreatic cancer Neg Hx    Esophageal cancer Neg Hx    Rectal cancer Neg Hx    Allergies  Allergen Reactions   Lisinopril     Cough    Morphine     REACTION: skin crawling   Oxycodone Itching   Rofecoxib     REACTION: blurred vision, dizziness   Current Outpatient Medications on File Prior to Visit  Medication Sig Dispense Refill   fluticasone (FLONASE) 50 MCG/ACT nasal spray Place 2 sprays into both nostrils daily. 48 g 3   ketoconazole (NIZORAL) 2 % cream Apply 1 application topically 2 (two) times daily. 15 g 3   methocarbamol (ROBAXIN) 500 MG tablet Take 1 tablet (500 mg total) by mouth every 8 (eight) hours as needed for muscle spasms (neck pain and headache). Caution of sedation 30 tablet 1   Multiple Vitamin (MULTIVITAMIN) capsule Take 1 capsule by mouth daily.     valACYclovir (VALTREX) 1000 MG tablet TAKE 1/2 TABLET  BY MOUTH 2 TIMES DAILY AS NEEDED. 90 tablet 1   No current facility-administered medications on file prior to visit.     Review of Systems  Constitutional:  Negative for activity change, appetite change, fatigue, fever and unexpected weight change.  HENT:  Negative for congestion, ear pain, rhinorrhea, sinus pressure and sore throat.   Eyes:  Negative for pain, redness and visual disturbance.  Respiratory:  Negative for cough, shortness of breath and wheezing.   Cardiovascular:  Negative for  chest pain and palpitations.  Gastrointestinal:  Negative for abdominal pain, blood in stool, constipation and diarrhea.  Endocrine: Negative for polydipsia and polyuria.  Genitourinary:  Negative for dysuria, frequency and urgency.  Musculoskeletal:  Negative for arthralgias, back pain and myalgias.       Right leg cramps  Skin:  Negative for pallor and rash.  Allergic/Immunologic: Negative for environmental allergies.  Neurological:  Positive for headaches. Negative for dizziness and syncope.  Hematological:  Negative for adenopathy. Does not bruise/bleed easily.  Psychiatric/Behavioral:  Negative for decreased concentration and dysphoric mood. The patient is not nervous/anxious.        Objective:   Physical Exam Constitutional:      General: She is not in acute distress.    Appearance: Normal appearance. She is well-developed. She is obese. She is not ill-appearing or diaphoretic.  HENT:     Head: Normocephalic and atraumatic.     Right Ear: Tympanic membrane, ear canal and external ear normal.     Left Ear: Tympanic membrane, ear canal and external ear normal.     Nose: Nose normal. No congestion.     Mouth/Throat:     Mouth: Mucous membranes are moist.     Pharynx: Oropharynx is clear. No posterior oropharyngeal erythema.  Eyes:     General: No scleral icterus.    Extraocular Movements: Extraocular movements intact.     Conjunctiva/sclera: Conjunctivae normal.     Pupils: Pupils are equal, round, and reactive to light.  Neck:     Thyroid: No thyromegaly.     Vascular: No carotid bruit or JVD.  Cardiovascular:     Rate and Rhythm: Normal rate and regular rhythm.     Pulses: Normal pulses.     Heart sounds: Normal heart sounds.     No gallop.  Pulmonary:     Effort: Pulmonary effort is normal. No respiratory distress.     Breath sounds: Normal breath sounds. No wheezing.     Comments: Good air exch Chest:     Chest wall: No tenderness.  Abdominal:     General:  Bowel sounds are normal. There is no distension or abdominal bruit.     Palpations: Abdomen is soft. There is no mass.     Tenderness: There is no abdominal tenderness.     Hernia: No hernia is present.  Genitourinary:    Comments: Breast and pelvic exam are done by gyn   Musculoskeletal:        General: No tenderness. Normal range of motion.     Cervical back: Normal range of motion and neck supple. No rigidity. No muscular tenderness.     Right lower leg: No edema.     Left lower leg: No edema.     Comments: No kyphosis   Lymphadenopathy:     Cervical: No cervical adenopathy.  Skin:    General: Skin is warm and dry.     Coloration: Skin is not pale.     Findings: No erythema or rash.  Neurological:     Mental Status: She is alert. Mental status is at baseline.     Cranial Nerves: No cranial nerve deficit.     Motor: No weakness or abnormal muscle tone.     Coordination: Coordination normal.     Gait: Gait normal.     Deep Tendon Reflexes: Reflexes are normal and symmetric. Reflexes normal.  Psychiatric:        Mood and Affect: Mood normal.        Cognition and Memory: Cognition and memory normal.           Assessment & Plan:   Problem List Items Addressed This Visit       Cardiovascular and Mediastinum   Essential hypertension    bp in fair control at this  time  BP Readings from Last 1 Encounters:  12/05/22 138/85  No changes needed Most recent labs reviewed  Disc lifstyle change with low sodium diet and exercise   Plan to continue Losartan 50 mg daily  Hctz 12.5 mg daily       Relevant Medications   hydrochlorothiazide (HYDRODIURIL) 12.5 MG tablet   losartan (COZAAR) 50 MG tablet     Musculoskeletal and Integument   Onychomycosis    Thick discolored nail r great toe H/o tinea in past  Suspect fungal Ref to podiatry to eva/possibly debride and treat  Enc her to continue ketoconazole for athlete's foot      Relevant Orders   Ambulatory referral to  Podiatry     Other   Leg cramp    Recurrent RLE cramp (calf) ever since her back surgery  This was the affected side with disc problem  Notes she tends to cramp when she points her toe   Enc use of heat/massage and stretching  Proper shoes/no heels or wedges if this flares things   Nl exam      Obesity (BMI 30-39.9)    Discussed how this problem influences overall health and the risks it imposes  Reviewed plan for weight loss with lower calorie diet (via better food choices and also portion control or program like weight watchers) and exercise building up to or more than 30 minutes 5 days per week including some aerobic activity   Enc to eat less fast food Get back to mood prep  Make plan for exercise /will have to schedule around work       Prediabetes    Lab Results  Component Value Date   HGBA1C 6.0 (H) 12/01/2022  This is up  disc imp of low glycemic diet and wt loss to prevent DM2  Enc her to get out of habit of fast food/eating out      Routine general medical examination at a health care facility - Primary    Reviewed health habits including diet and exercise and skin cancer prevention Reviewed appropriate screening tests for age  Also reviewed health mt list, fam hx and immunization status , as well as social and family history   See HPI Labs reviewed  Mammogram due, is planned at gyn office next month  Pap per gyn/ has had a hysterectomy  Colonoscopy utd 03/2019  Disc plan for better diet/exercise       Screening mammogram, encounter for    Pt plans mammogram and breast exam at gyn office next month      Tension headache    Headache at temples today  ? If migraine variant or tension  Reassuring exam  Bp better on 2nd check   Disc imp of fluids Control caffeine intake  Inst to take her methocarbamol today and try dose of ibuprofen if needed  Update if not starting to improve in a week or if worsening

## 2022-12-05 NOTE — Patient Instructions (Addendum)
Heat, massage, and stretching may help with the leg cramps   To prevent diabetes  Try to get most of your carbohydrates from produce (with the exception of white potatoes)  Eat less bread/pasta/rice/snack foods/cereals/sweets and other items from the middle of the grocery store (processed carbs)  Consider meal prepping  Also food delivery program- low carb is a good option   Add exercise when you can   I will place a podiatry referral  If you don't get a call in 1-2 weeks to set that up let us know   Take a methocarbamol (muscle relaxer) and ibuprofen for your headache when you get home Let us know if the headache does not improve

## 2022-12-05 NOTE — Assessment & Plan Note (Signed)
Pt plans mammogram and breast exam at gyn office next month

## 2022-12-05 NOTE — Assessment & Plan Note (Signed)
Reviewed health habits including diet and exercise and skin cancer prevention Reviewed appropriate screening tests for age  Also reviewed health mt list, fam hx and immunization status , as well as social and family history   See HPI Labs reviewed  Mammogram due, is planned at gyn office next month  Pap per gyn/ has had a hysterectomy  Colonoscopy utd 03/2019  Disc plan for better diet/exercise

## 2022-12-05 NOTE — Assessment & Plan Note (Signed)
Thick discolored nail r great toe H/o tinea in past  Suspect fungal Ref to podiatry to eva/possibly debride and treat  Enc her to continue ketoconazole for athlete's foot

## 2022-12-05 NOTE — Assessment & Plan Note (Signed)
bp in fair control at this time  BP Readings from Last 1 Encounters:  12/05/22 138/85   No changes needed Most recent labs reviewed  Disc lifstyle change with low sodium diet and exercise   Plan to continue Losartan 50 mg daily  Hctz 12.5 mg daily

## 2022-12-20 ENCOUNTER — Ambulatory Visit: Payer: Managed Care, Other (non HMO) | Admitting: Podiatry

## 2022-12-22 ENCOUNTER — Ambulatory Visit (INDEPENDENT_AMBULATORY_CARE_PROVIDER_SITE_OTHER): Payer: Managed Care, Other (non HMO) | Admitting: Podiatry

## 2022-12-22 DIAGNOSIS — B351 Tinea unguium: Secondary | ICD-10-CM

## 2022-12-22 MED ORDER — TERBINAFINE HCL 250 MG PO TABS: 250.0000 mg | ORAL_TABLET | Freq: Every day | ORAL | 0 refills | Status: DC

## 2022-12-22 NOTE — Progress Notes (Signed)
   Chief Complaint  Patient presents with   Foot Problem    ONYCHOMYCOSIS, RIGHT OVER LEFT     Subjective: 46 y.o. female presenting today as a new patient for evaluation of thickening with discoloration to the toenails that has been ongoing for a few years now.  Patient believes it began with a pedicure.  She has tried home remedies such as tea tree oil with no improvement.  She went to her PCP who referred her here.  She presents for further treatment and evaluation  Past Medical History:  Diagnosis Date   Abnormal Pap smear    cin-1   Allergic rhinitis    seasonal   Allergy    Diabetes mellitus without complication (HCC)    boarderline per pt   GERD (gastroesophageal reflux disease)    Herpes simplex without mention of complication    hsv-2   Hypertension     Past Surgical History:  Procedure Laterality Date   CERVIX LESION DESTRUCTION     COLPOSCOPY  1997   abnormal pap   LAMINECTOMY AND MICRODISCECTOMY LUMBAR SPINE  12/2007   L5-S1   PARTIAL HYSTERECTOMY  11/2005   bleeding   WISDOM TOOTH EXTRACTION      Allergies  Allergen Reactions   Lisinopril     Cough    Morphine     REACTION: skin crawling   Oxycodone Itching   Rofecoxib     REACTION: blurred vision, dizziness    Objective: Physical Exam General: The patient is alert and oriented x3 in no acute distress.  Dermatology: Hyperkeratotic, discolored, thickened, onychodystrophy noted bilateral. Skin is warm, dry and supple bilateral lower extremities. Negative for open lesions or macerations.  Vascular: Palpable pedal pulses bilaterally. No edema or erythema noted. Capillary refill within normal limits.  Neurological: Epicritic and protective threshold grossly intact bilaterally.   Musculoskeletal Exam: No pedal deformity noted  Assessment: #1 Onychomycosis of toenails bilateral  Plan of Care:  #1 Patient was evaluated. #2  Today we discussed different treatment options including oral, topical,  and laser antifungal treatment modalities.  We discussed their efficacies and side effects.  Patient opts for oral antifungal treatment modality #3 prescription for Lamisil 250 mg #90 daily. Pt denies a history of liver pathology or symptoms.  Patient is otherwise healthy.  Comprehensive metabolic panel 12/01/2022 liver enzymes WNL #4 return to clinic 6 months  *Works for [employer]   Felecia Shelling, DPM [facility]  Dr. Felecia Shelling, DPM    [address] [place], [place] [address]                Office [phone]  Fax [phone]

## 2023-01-01 ENCOUNTER — Other Ambulatory Visit (HOSPITAL_COMMUNITY)
Admission: RE | Admit: 2023-01-01 | Discharge: 2023-01-01 | Disposition: A | Discharge status: Home / Self Care | Payer: Managed Care, Other (non HMO) | Source: Ambulatory Visit | Attending: Family Medicine | Admitting: Family Medicine

## 2023-01-01 ENCOUNTER — Ambulatory Visit: Payer: Managed Care, Other (non HMO) | Admitting: Family Medicine

## 2023-01-01 ENCOUNTER — Encounter: Payer: Self-pay | Admitting: Family Medicine

## 2023-01-01 VITALS — BP 140/70 | HR 93 | Temp 97.7°F | Ht 63.0 in | Wt 223.5 lb

## 2023-01-01 DIAGNOSIS — Z1231 Encounter for screening mammogram for malignant neoplasm of breast: Secondary | ICD-10-CM | POA: Diagnosis not present

## 2023-01-01 DIAGNOSIS — Z113 Encounter for screening for infections with a predominantly sexual mode of transmission: Secondary | ICD-10-CM | POA: Diagnosis not present

## 2023-01-01 DIAGNOSIS — Z01419 Encounter for gynecological examination (general) (routine) without abnormal findings: Secondary | ICD-10-CM | POA: Diagnosis present

## 2023-01-01 MED ORDER — VALACYCLOVIR HCL 1 G PO TABS: ORAL_TABLET | ORAL | 0 refills | Status: DC

## 2023-01-01 NOTE — Assessment & Plan Note (Signed)
Gc/chlam and hpv with pap   RPR, hep C and HIV ordered

## 2023-01-01 NOTE — Progress Notes (Signed)
Subjective:    Patient ID: [name], female    DOB: [birth year], 46 y.o.   MRN: [id number]  HPI Pt presents to discuss gyn care  Wt Readings from Last 3 Encounters:  01/01/23 223 lb 8 oz (101.4 kg)  12/05/22 219 lb (99.3 kg)  11/07/22 215 lb 6.4 oz (97.7 kg)   39.59 kg/m   Vitals:   01/01/23 1048  BP: (!) 140/70  Pulse: 93  Temp: 97.7 F (36.5 C)  TempSrc: Temporal  SpO2: 97%  Weight: 223 lb 8 oz (101.4 kg)  Height: 5\' 3"  (1.6 m)   [facility] Then had [facility] issues- they may stop taking it   Going there for years   Had hysterectomy for fibroids and bleeding  Years prior had colposcopy and then after treatment no issues after that    Pap 12/2019 ?- cannot tell from chart  Assume is is normal   No gyn concerns today  Used to get BV often and sometimes yeast  They treated with metrogel  Takes probiotic for women (? Fortify brand for feminine health)  Had a full bartholin gland in the past- but not a cyst   No STD exposure  Likes to get tested yearly   One partner  No new partners    Mammogram - is at gyn also  No lumps  01/18/22  Is due soon   Did hep B imms in past    Patient Active Problem List   Diagnosis Date Noted   Visit for routine gyn exam 01/01/2023   Encounter for screening mammogram for breast cancer 01/01/2023   Routine general medical examination at a health care facility 12/05/2022   Onychomycosis 12/05/2022   Leg cramp 12/05/2022   Tension headache 08/25/2022   Tinea pedis 12/01/2020   Obesity (BMI 30-39.9) 12/03/2019   Hematuria, microscopic 03/04/2019   Prediabetes 11/27/2017   Screen for STD (sexually transmitted disease) 11/24/2016   Screening mammogram, encounter for 11/24/2016   Sleep apnea 01/03/2016   Encounter for general adult medical examination with abnormal findings 09/26/2013   Essential hypertension 09/16/2012   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   Allergic rhinitis 07/02/2007    GERD 07/02/2007   Past Medical History:  Diagnosis Date   Abnormal Pap smear    cin-1   Allergic rhinitis    seasonal   Allergy    Diabetes mellitus without complication (HCC)    boarderline per pt   GERD (gastroesophageal reflux disease)    Herpes simplex without mention of complication    hsv-2   Hypertension    Past Surgical History:  Procedure Laterality Date   CERVIX LESION DESTRUCTION     COLPOSCOPY  1997   abnormal pap   LAMINECTOMY AND MICRODISCECTOMY LUMBAR SPINE  12/2007   L5-S1   PARTIAL HYSTERECTOMY  11/2005   bleeding   WISDOM TOOTH EXTRACTION     Social History   Tobacco Use   Smoking status: Never   Smokeless tobacco: Never  Vaping Use   Vaping Use: Never used  Substance Use Topics   Alcohol use: Yes    Alcohol/week: 0.0 standard drinks of alcohol    Comment: social   Drug use: No   Family History  Problem Relation Age of Onset   Hypertension Mother 95   Hypertension Father    Diabetes Paternal Grandfather    Breast cancer Maternal Grandmother    Colon cancer Other        M.52  aunt   Stroke Paternal Grandmother    Stomach cancer Neg Hx    Pancreatic cancer Neg Hx    Esophageal cancer Neg Hx    Rectal cancer Neg Hx    Allergies  Allergen Reactions   Lisinopril     Cough    Morphine     REACTION: skin crawling   Oxycodone Itching   Rofecoxib     REACTION: blurred vision, dizziness   Current Outpatient Medications on File Prior to Visit  Medication Sig Dispense Refill   fluticasone (FLONASE) 50 MCG/ACT nasal spray Place 2 sprays into both nostrils daily. 48 g 3   hydrochlorothiazide (HYDRODIURIL) 12.5 MG tablet Take 1 tablet (12.5 mg total) by mouth daily. for blood pressure. 90 tablet 3   ketoconazole (NIZORAL) 2 % cream Apply 1 application topically 2 (two) times daily. 15 g 3   losartan (COZAAR) 50 MG tablet Take 1 tablet (50 mg total) by mouth daily. 90 tablet 3   methocarbamol (ROBAXIN) 500 MG tablet Take 1 tablet (500 mg  total) by mouth every 8 (eight) hours as needed for muscle spasms (neck pain and headache). Caution of sedation 30 tablet 1   Multiple Vitamin (MULTIVITAMIN) capsule Take 1 capsule by mouth daily.     terbinafine (LAMISIL) 250 MG tablet Take 1 tablet (250 mg total) by mouth daily. 90 tablet 0   No current facility-administered medications on file prior to visit.      Review of Systems  Genitourinary:  Negative for dysuria, frequency, hematuria, pelvic pain, vaginal bleeding, vaginal discharge and vaginal pain.       Objective:   Physical Exam Constitutional:      General: She is not in acute distress.    Appearance: She is well-developed.  HENT:     Head: Normocephalic and atraumatic.  Eyes:     Conjunctiva/sclera: Conjunctivae normal.     Pupils: Pupils are equal, round, and reactive to light.  Neck:     Thyroid: No thyromegaly.     Vascular: No carotid bruit or JVD.  Cardiovascular:     Rate and Rhythm: Normal rate and regular rhythm.     Heart sounds: Normal heart sounds.     No gallop.  Pulmonary:     Effort: Pulmonary effort is normal. No respiratory distress.  Abdominal:     General: There is no distension or abdominal bruit.     Palpations: Abdomen is soft.     Comments: No suprapubic tenderness or fullness    Genitourinary:    Comments: Breast exam: No mass, nodules, thickening, tenderness, bulging, retraction, inflamation, nipple discharge or skin changes noted.  No axillary or clavicular LA.    Dense tissue noted               Anus appears normal w/o hemorrhoids or masses       External genitalia : nl appearance and hair distribution/no lesions       Urethral meatus : nl size, no lesions or prolapse       Urethra: no masses, tenderness or scarring      Bladder : no masses or tenderness       Vagina: nl general appearance, no discharge or  Lesions, no significant cystocele  or rectocele       Cervix: surg absent     Uterus: surg absent      Adnexa : no  masses, tenderness, enlargement or nodularity          Musculoskeletal:  Cervical back: Normal range of motion and neck supple.     Right lower leg: No edema.     Left lower leg: No edema.  Lymphadenopathy:     Cervical: No cervical adenopathy.  Skin:    General: Skin is warm and dry.     Coloration: Skin is not pale.     Findings: No rash.  Neurological:     Mental Status: She is alert.     Coordination: Coordination normal.     Deep Tendon Reflexes: Reflexes are normal and symmetric. Reflexes normal.  Psychiatric:        Mood and Affect: Mood normal.           Assessment & Plan:   Problem List Items Addressed This Visit       Other   Encounter for screening mammogram for breast cancer    Screen mammo ordered at [facility] (pt generally does at gyn)  Nl exam/dense tissue  Enc self exams       Relevant Orders   MM 3D SCREEN BREAST BILATERAL   Screen for STD (sexually transmitted disease)    Gc/chlam and hpv with pap   RPR, hep C and HIV ordered       Relevant Orders   Hepatitis C antibody   HIV Antibody (routine testing w rflx)   RPR   Visit for routine gyn exam - Primary    Pt has h/o partial hysterectomy No menopause symptoms Occ BV , no sympt today  Pap and gyn exam done Nl exam Did req HPV screen and gc/chlam with pap   Std screening ordered  No new symptoms  Pt takes valtrex prn for BV      Relevant Orders   Cytology - PAP([place])

## 2023-01-01 NOTE — Assessment & Plan Note (Signed)
Screen mammo ordered at [facility] (pt generally does at gyn)  Nl exam/dense tissue  Enc self exams

## 2023-01-01 NOTE — Patient Instructions (Signed)
Call the [facility] breast center to set up your mammogram   Please call the location of your choice from the menu below to schedule your Mammogram and/or Bone Density appointment.    [facility]                      Phone:  [phone] [place], [place] [address]                                                             Services: Traditional and 3D Mammogram, [place] [facility]                 Phone: [phone] [address], [place] [address]    Service: Bone Density ONLY   *this site does NOT perform mammograms  [place]                        Phone:  [phone] [address], [place] [address]                                            Services:  3D Mammogram and [place] at [facility]   Phone:  [phone]   [place], [place] [address]                                            Services: 3D Mammogram and [place]  [place] [facility])  Phone:  [phone]   [address] [place], [place] [address]  Services:  3D Mammogram and Bone Density  

## 2023-01-01 NOTE — Assessment & Plan Note (Signed)
Pt has h/o partial hysterectomy No menopause symptoms Occ BV , no sympt today  Pap and gyn exam done Nl exam Did req HPV screen and gc/chlam with pap   Std screening ordered  No new symptoms  Pt takes valtrex prn for BV

## 2023-01-02 LAB — HEPATITIS C ANTIBODY: Hepatitis C Ab: NONREACTIVE

## 2023-01-02 LAB — HIV ANTIBODY (ROUTINE TESTING W REFLEX): HIV 1&2 Ab, 4th Generation: NONREACTIVE

## 2023-01-02 LAB — RPR: RPR Ser Ql: NONREACTIVE

## 2023-01-03 LAB — CYTOLOGY - PAP
Chlamydia: NEGATIVE
Comment: NEGATIVE
Comment: NEGATIVE
Comment: NORMAL
Diagnosis: NEGATIVE
High risk HPV: NEGATIVE
Neisseria Gonorrhea: NEGATIVE

## 2023-01-23 ENCOUNTER — Ambulatory Visit
Admission: RE | Admit: 2023-01-23 | Discharge: 2023-01-23 | Disposition: A | Discharge status: Home / Self Care | Payer: Managed Care, Other (non HMO) | Source: Ambulatory Visit | Attending: Family Medicine | Admitting: Family Medicine

## 2023-01-23 DIAGNOSIS — Z1231 Encounter for screening mammogram for malignant neoplasm of breast: Secondary | ICD-10-CM

## 2023-01-26 ENCOUNTER — Other Ambulatory Visit: Payer: Self-pay | Admitting: *Deleted

## 2023-01-26 ENCOUNTER — Inpatient Hospital Stay
Admission: RE | Admit: 2023-01-26 | Discharge: 2023-01-26 | Disposition: A | Discharge status: Home / Self Care | Payer: Self-pay | Source: Ambulatory Visit | Attending: *Deleted | Admitting: *Deleted

## 2023-01-26 DIAGNOSIS — Z1231 Encounter for screening mammogram for malignant neoplasm of breast: Secondary | ICD-10-CM

## 2023-01-29 ENCOUNTER — Other Ambulatory Visit: Payer: Self-pay | Admitting: Family Medicine

## 2023-01-29 DIAGNOSIS — R921 Mammographic calcification found on diagnostic imaging of breast: Secondary | ICD-10-CM

## 2023-01-29 DIAGNOSIS — R928 Other abnormal and inconclusive findings on diagnostic imaging of breast: Secondary | ICD-10-CM

## 2023-02-01 ENCOUNTER — Ambulatory Visit
Admission: RE | Admit: 2023-02-01 | Discharge: 2023-02-01 | Disposition: A | Discharge status: Home / Self Care | Payer: Managed Care, Other (non HMO) | Source: Ambulatory Visit | Attending: Family Medicine | Admitting: Family Medicine

## 2023-02-01 DIAGNOSIS — R921 Mammographic calcification found on diagnostic imaging of breast: Secondary | ICD-10-CM | POA: Diagnosis present

## 2023-02-01 DIAGNOSIS — R928 Other abnormal and inconclusive findings on diagnostic imaging of breast: Secondary | ICD-10-CM | POA: Diagnosis present

## 2023-02-01 DIAGNOSIS — Z1231 Encounter for screening mammogram for malignant neoplasm of breast: Secondary | ICD-10-CM | POA: Insufficient documentation

## 2023-02-02 ENCOUNTER — Other Ambulatory Visit: Payer: Self-pay | Admitting: Family Medicine

## 2023-02-02 DIAGNOSIS — R921 Mammographic calcification found on diagnostic imaging of breast: Secondary | ICD-10-CM

## 2023-02-02 DIAGNOSIS — R928 Other abnormal and inconclusive findings on diagnostic imaging of breast: Secondary | ICD-10-CM

## 2023-02-12 ENCOUNTER — Ambulatory Visit
Admission: RE | Admit: 2023-02-12 | Discharge: 2023-02-12 | Disposition: A | Discharge status: Home / Self Care | Payer: Managed Care, Other (non HMO) | Source: Ambulatory Visit | Attending: Family Medicine | Admitting: Family Medicine

## 2023-02-12 DIAGNOSIS — R921 Mammographic calcification found on diagnostic imaging of breast: Secondary | ICD-10-CM

## 2023-02-12 DIAGNOSIS — R928 Other abnormal and inconclusive findings on diagnostic imaging of breast: Secondary | ICD-10-CM

## 2023-02-12 HISTORY — PX: BREAST BIOPSY: SHX20

## 2023-02-12 MED ORDER — LIDOCAINE HCL (PF) 1 % IJ SOLN
5.0000 mL | Freq: Once | INTRAMUSCULAR | Status: AC
  Administered 2023-02-12: 5 mL

## 2023-02-12 MED ORDER — LIDOCAINE-EPINEPHRINE 1 %-1:100000 IJ SOLN
20.0000 mL | Freq: Once | INTRAMUSCULAR | Status: AC
  Administered 2023-02-12: 20 mL

## 2023-02-13 LAB — SURGICAL PATHOLOGY

## 2023-02-15 ENCOUNTER — Encounter: Payer: Self-pay | Admitting: Family Medicine

## 2023-02-15 ENCOUNTER — Ambulatory Visit: Payer: Managed Care, Other (non HMO) | Admitting: Family Medicine

## 2023-02-15 VITALS — BP 138/80 | HR 83 | Temp 97.2°F | Ht 63.0 in | Wt 219.1 lb

## 2023-02-15 DIAGNOSIS — L309 Dermatitis, unspecified: Secondary | ICD-10-CM | POA: Diagnosis not present

## 2023-02-15 MED ORDER — TRIAMCINOLONE ACETONIDE 0.1 % EX CREA: 1.0000 | TOPICAL_CREAM | Freq: Two times a day (BID) | CUTANEOUS | 0 refills | Status: DC

## 2023-02-15 NOTE — Assessment & Plan Note (Signed)
Here acute, contact/irritant dermatitis secondary to bandage or medication used during breast biopsy.  No sign and symptom of bacterial infection.  No sign of hematoma or abscess at biopsy site.  Rash is where her bandage was as opposed to at biopsy site. Will treat with topical triamcinolone 0.1% twice daily x 1 to 2 weeks.  Return and ER precautions provided.  She will let me know if any redness is spreading, pain increasing.

## 2023-02-15 NOTE — Progress Notes (Signed)
Patient ID: [name], female    DOB: 02-09-1976, 47 y.o.   MRN: [id number]  This visit was conducted in person.  BP 138/80   Pulse 83   Temp (!) 97.2 F (36.2 C) (Temporal)   Ht 5' 3"$  (1.6 m)   Wt 219 lb 2 oz (99.4 kg)   LMP 05/09/2006   SpO2 95%   BMI 38.82 kg/m    CC:  Chief Complaint  Patient presents with   Blister    C/o blisters on R breast where bx was done last wk. Also, c/o itching in the area.     Subjective:   HPI: [name] is a 47 y.o. female patient of Dr. Alba Cory presenting on 02/15/2023 for Blister (C/o blisters on R breast where bx was done last wk. Also, c/o itching in the area. )  She reports new onset blister and rash on her right breast where breast biopsy was done 3 days ago.  Area is itchy and irritated.  She has not treated it with anything. Rash  noted after bandage removed.. blister.. clear drainage...  rash was where the tegaderm was.  Blister has deflated some.   No fever, no flu like symptoms.  No personal history of boils or MRSA.   Bx results return:  fibroadenoma, calcification. Back to annual mammogram.  Relevant past medical, surgical, family and social history reviewed and updated as indicated. Interim medical history since our last visit reviewed. Allergies and medications reviewed and updated. Outpatient Medications Prior to Visit  Medication Sig Dispense Refill   fluticasone (FLONASE) 50 MCG/ACT nasal spray Place 2 sprays into both nostrils daily. 48 g 3   hydrochlorothiazide (HYDRODIURIL) 12.5 MG tablet Take 1 tablet (12.5 mg total) by mouth daily. for blood pressure. 90 tablet 3   ketoconazole (NIZORAL) 2 % cream Apply 1 application topically 2 (two) times daily. 15 g 3   losartan (COZAAR) 50 MG tablet Take 1 tablet (50 mg total) by mouth daily. 90 tablet 3   methocarbamol (ROBAXIN) 500 MG tablet Take 1 tablet (500 mg total) by mouth every 8 (eight) hours as needed for muscle spasms (neck pain and  headache). Caution of sedation 30 tablet 1   Multiple Vitamin (MULTIVITAMIN) capsule Take 1 capsule by mouth daily.     terbinafine (LAMISIL) 250 MG tablet Take 1 tablet (250 mg total) by mouth daily. 90 tablet 0   valACYclovir (VALTREX) 1000 MG tablet TAKE 1/2 TABLET  BY MOUTH 2 TIMES DAILY AS NEEDED. 90 tablet 0   No facility-administered medications prior to visit.     Per HPI unless specifically indicated in ROS section below Review of Systems  Constitutional:  Negative for fatigue and fever.  HENT:  Negative for congestion.   Eyes:  Negative for pain.  Respiratory:  Negative for cough and shortness of breath.   Cardiovascular:  Negative for chest pain, palpitations and leg swelling.  Gastrointestinal:  Negative for abdominal pain.  Genitourinary:  Negative for dysuria and vaginal bleeding.  Musculoskeletal:  Negative for back pain.  Neurological:  Negative for syncope, light-headedness and headaches.  Psychiatric/Behavioral:  Negative for dysphoric mood.    Objective:  BP 138/80   Pulse 83   Temp (!) 97.2 F (36.2 C) (Temporal)   Ht 5' 3"$  (1.6 m)   Wt 219 lb 2 oz (99.4 kg)   LMP 05/09/2006   SpO2 95%   BMI 38.82 kg/m   Wt Readings from Last 3 Encounters:  02/15/23  219 lb 2 oz (99.4 kg)  01/01/23 223 lb 8 oz (101.4 kg)  12/05/22 219 lb (99.3 kg)      Physical Exam Constitutional:      General: She is not in acute distress.    Appearance: Normal appearance. She is well-developed. She is not ill-appearing or toxic-appearing.  HENT:     Head: Normocephalic.     Right Ear: Hearing, tympanic membrane, ear canal and external ear normal. Tympanic membrane is not erythematous, retracted or bulging.     Left Ear: Hearing, tympanic membrane, ear canal and external ear normal. Tympanic membrane is not erythematous, retracted or bulging.     Nose: No mucosal edema or rhinorrhea.     Right Sinus: No maxillary sinus tenderness or frontal sinus tenderness.     Left Sinus: No  maxillary sinus tenderness or frontal sinus tenderness.     Mouth/Throat:     Pharynx: Uvula midline.  Eyes:     General: Lids are normal. Lids are everted, no foreign bodies appreciated.     Conjunctiva/sclera: Conjunctivae normal.     Pupils: Pupils are equal, round, and reactive to light.  Neck:     Thyroid: No thyroid mass or thyromegaly.     Vascular: No carotid bruit.     Trachea: Trachea normal.  Cardiovascular:     Rate and Rhythm: Normal rate and regular rhythm.     Pulses: Normal pulses.     Heart sounds: Normal heart sounds, S1 normal and S2 normal. No murmur heard.    No friction rub. No gallop.  Pulmonary:     Effort: Pulmonary effort is normal. No tachypnea or respiratory distress.     Breath sounds: Normal breath sounds. No decreased breath sounds, wheezing, rhonchi or rales.  Abdominal:     General: Bowel sounds are normal.     Palpations: Abdomen is soft.     Tenderness: There is no abdominal tenderness.  Musculoskeletal:     Cervical back: Normal range of motion and neck supple.  Skin:    General: Skin is warm and dry.     Findings: Rash present.  Neurological:     Mental Status: She is alert.  Psychiatric:        Mood and Affect: Mood is not anxious or depressed.        Speech: Speech normal.        Behavior: Behavior normal. Behavior is cooperative.        Thought Content: Thought content normal.        Judgment: Judgment normal.       Results for orders placed or performed during the hospital encounter of 02/12/23  Surgical pathology  Result Value Ref Range   SURGICAL PATHOLOGY      SURGICAL PATHOLOGY CASE: [id number] PATIENT: [facility] Surgical Pathology Report     Specimen Submitted: A. Breast, right  Clinical History: CALCS seen at mammography.  PROB. FA or fibroadenomatoid changes.  R/O DCIS      DIAGNOSIS: A.  BREAST, RIGHT, UPPER OUTER QUADRANT; CORE BIOPSIES: - NO EVIDENCE OF SIGNIFICANT ATYPIA OR MALIGNANCY. -  CALCIFICATIONS IDENTIFIED, ASSOCIATED WITH FIBROSIS.  GROSS DESCRIPTION: A. Labeled: Right breast stereo biopsy calcs upper outer quadrant Received: in a formalin-filled [place] collection device Specimen radiograph image(s) available for review Time/Date in fixative: Collected at 8:27 AM on 02/12/2023 and placed in formalin at 8:29 AM on 02/12/2023 Cold ischemic time: Less than 5 minutes Total fixation time: Approximately 8.75 hours Core pieces: Multiple Measurement:  Aggregate, 2.5 x 1 x 0.3 cm Description / comments: Received are cores and fragments of yellow fibrofatty tissue.  The accompanying diagram h as sections A and B checked. Inked: Blue Entirely submitted in cassette(s):  1 - section A 2 - section B 3 - remaining tissue from section C and freely floating within the container  RB 02/12/2023  Final Diagnosis performed by Theodora Blow, MD.   Electronically signed 02/13/2023 8:28:08AM The electronic signature indicates that the named Attending Pathologist has evaluated the specimen Technical component performed at [facility], [address], [place], [place] [address] Lab: [phone] Dir: Rush Farmer, MD, MMM  Professional component performed at [facility], [facility], [place], [place], [place] [address] Lab: [phone] Dir: Kathi Simpers, MD     Assessment and Plan  Dermatitis Assessment & Plan: Here acute, contact/irritant dermatitis secondary to bandage or medication used during breast biopsy.  No sign and symptom of bacterial infection.  No sign of hematoma or abscess at biopsy site.  Rash is where her bandage was as opposed to at biopsy site. Will treat with topical triamcinolone 0.1% twice daily x 1 to 2 weeks.  Return and ER precautions provided.  She will let me know if any redness is spreading, pain increasing.   Other orders -     Triamcinolone Acetonide; Apply 1 Application topically 2 (two) times daily.  Dispense: 30 g; Refill:  0    Return if symptoms worsen or fail to improve.   Eliezer Lofts, MD

## 2023-04-04 ENCOUNTER — Other Ambulatory Visit: Payer: Self-pay | Admitting: Family Medicine

## 2023-04-04 NOTE — Telephone Encounter (Signed)
Last filled on 01/01/23 #90 tabs with 0 refills, CPE scheduled 12/10/23

## 2023-06-26 ENCOUNTER — Ambulatory Visit: Payer: Managed Care, Other (non HMO) | Admitting: Podiatry

## 2023-06-26 DIAGNOSIS — Z79899 Other long term (current) drug therapy: Secondary | ICD-10-CM

## 2023-06-26 DIAGNOSIS — B351 Tinea unguium: Secondary | ICD-10-CM

## 2023-06-26 MED ORDER — TERBINAFINE HCL 250 MG PO TABS: 250.0000 mg | ORAL_TABLET | Freq: Every day | ORAL | 0 refills | Status: DC

## 2023-06-26 NOTE — Progress Notes (Signed)
   Chief Complaint  Patient presents with   Nail Problem    Patient came in today for a nail fungus follow-up, patient denies any pain, nails are growing out, TX: Lamisil     Subjective: 47 y.o. female presenting today for follow-up evaluation of fungal nails bilateral.  Overall improvement.  She tolerated the oral Lamisil without any complication or issues.  Presenting for follow-up.  Past Medical History:  Diagnosis Date   Abnormal Pap smear    cin-1   Allergic rhinitis    seasonal   Allergy    Diabetes mellitus without complication (HCC)    boarderline per pt   GERD (gastroesophageal reflux disease)    Herpes simplex without mention of complication    hsv-2   Hypertension     Past Surgical History:  Procedure Laterality Date   BREAST BIOPSY Right 02/12/2023   stereo bx, calcs, "X" clip-path pending   BREAST BIOPSY Right 02/12/2023   MM RT BREAST BX W LOC DEV 1ST LESION IMAGE BX SPEC STEREO GUIDE 02/12/2023 [facility]   CERVIX LESION DESTRUCTION     COLPOSCOPY  1997   abnormal pap   LAMINECTOMY AND MICRODISCECTOMY LUMBAR SPINE  12/2007   L5-S1   PARTIAL HYSTERECTOMY  11/2005   bleeding   WISDOM TOOTH EXTRACTION      Allergies  Allergen Reactions   Lisinopril     Cough    Morphine     REACTION: skin crawling   Oxycodone Itching   Rofecoxib     REACTION: blurred vision, dizziness    12/22/2022   06/26/2023  Objective: Physical Exam General: The patient is alert and oriented x3 in no acute distress.  Dermatology: Overall significant improvement.  It appears that the nails are growing out healthy.  The distal portion of the nail continues to demonstrate some fungal elements  Vascular: Palpable pedal pulses bilaterally. No edema or erythema noted. Capillary refill within normal limits.  Neurological: Intact via light touch  Musculoskeletal Exam: No pedal deformity noted  Assessment: #1 Onychomycosis of toenails bilateral  Plan of Care:   -Patient was evaluated. -Overall significant improvement of the toenails.  Recommend 1 additional round of a 90-day supply of Lamisil to 50 mg #90. -Prescription for Lamisil 250 mg #90 daily.  -Updated hepatic function panel ordered -Return to clinic as needed  *Works for [employer], DPM [facility]  Dr. Felecia Shelling, DPM    [address] [place], [place] [address]                Office [phone]  Fax [phone]

## 2023-06-27 LAB — HEPATIC FUNCTION PANEL
ALT: 11 IU/L (ref 0–32)
AST: 18 IU/L (ref 0–40)
Albumin: 4.5 g/dL (ref 3.9–4.9)
Alkaline Phosphatase: 59 IU/L (ref 44–121)
Bilirubin Total: 0.6 mg/dL (ref 0.0–1.2)
Bilirubin, Direct: 0.16 mg/dL (ref 0.00–0.40)
Total Protein: 6.9 g/dL (ref 6.0–8.5)

## 2023-07-09 ENCOUNTER — Other Ambulatory Visit: Payer: Self-pay | Admitting: Family Medicine

## 2023-07-09 NOTE — Telephone Encounter (Signed)
Please review refill request. Last refilled 04/04/23 #90 no refills

## 2023-08-10 ENCOUNTER — Telehealth: Payer: Self-pay | Admitting: Family Medicine

## 2023-08-10 ENCOUNTER — Encounter: Payer: Self-pay | Admitting: Family Medicine

## 2023-08-10 LAB — HEMOGLOBIN A1C: Hemoglobin A1C: 6.1

## 2023-08-10 MED ORDER — METFORMIN HCL 500 MG PO TABS: 500.0000 mg | ORAL_TABLET | Freq: Two times a day (BID) | ORAL | 1 refills | Status: DC

## 2023-08-10 NOTE — Telephone Encounter (Signed)
Pt call in stated her A1c is up and would like a refill in Rx metformin . Rx is no longer on Pt med list pt did not want to schedule appointment . Please advise [phone]

## 2023-08-10 NOTE — Telephone Encounter (Signed)
Called pt to get more info since I didn't see a A1c on file. Pt said they do a yearly wellness screening at her job and they are able to get labs done at appt. Pt said her A1c came back as elevated at 6.1, pt said her glucose was also 112. Pt said she had not eaten since 10 pm that night and had labs done 9 am the next morning so she was fasting 11 hrs prior to labs being drawn.  Pt would like Metformin sent to [facility]

## 2023-08-10 NOTE — Telephone Encounter (Signed)
Sent  I abstracted the A1c also

## 2023-08-13 NOTE — Telephone Encounter (Signed)
Labs printed out and placed in PCP's inbox for review

## 2023-08-13 NOTE — Telephone Encounter (Signed)
See mychart. Pt uploaded copy of results and her comments

## 2023-08-23 ENCOUNTER — Other Ambulatory Visit: Payer: Self-pay | Admitting: Family Medicine

## 2023-08-23 MED ORDER — METFORMIN HCL ER 500 MG PO TB24: 500.0000 mg | ORAL_TABLET | Freq: Every day | ORAL | 1 refills | Status: DC

## 2023-08-23 NOTE — Telephone Encounter (Signed)
Let's try metformin xr 500 mg once daily  Slower release will often eliminate gi side effects If any issues let me know   Pended short supply to try  Please send to preferred local pharmacy

## 2023-08-23 NOTE — Telephone Encounter (Signed)
Patient contacted the office regarding medication metFORMIN (GLUCOPHAGE) 500 MG tablet , states she is experiencing some diarrhea and slight nausea while taking this medication. Patient stated she did not take this medication today, and wanted to know if there is something else she could try. Patient asked for this to be sent to a local pharmacy first rather than a 90 day pharmacy.

## 2023-08-23 NOTE — Telephone Encounter (Signed)
Pt notified of Dr. Tower's comments and Rx sent to pharmacy  

## 2023-09-14 ENCOUNTER — Other Ambulatory Visit: Payer: Self-pay | Admitting: Family Medicine

## 2023-09-19 ENCOUNTER — Other Ambulatory Visit: Payer: Self-pay | Admitting: Family Medicine

## 2023-09-19 MED ORDER — METFORMIN HCL ER 500 MG PO TB24: 500.0000 mg | ORAL_TABLET | Freq: Every day | ORAL | 3 refills | Status: DC

## 2023-09-19 NOTE — Telephone Encounter (Signed)
I sent it

## 2023-09-19 NOTE — Telephone Encounter (Signed)
Patient contacted the office regarding medication metFORMIN (GLUCOPHAGE-XR) 500 MG 24 hr tablet , says this is doing well for her and asked if Dr. Milinda Antis could re send this to the [facility] mail service pharmacy? PT aware she has a refill read at [facility] but would rather get it from mail delivery

## 2023-10-14 ENCOUNTER — Other Ambulatory Visit: Payer: Self-pay | Admitting: Family Medicine

## 2023-12-10 ENCOUNTER — Ambulatory Visit (INDEPENDENT_AMBULATORY_CARE_PROVIDER_SITE_OTHER): Payer: Managed Care, Other (non HMO) | Admitting: Family Medicine

## 2023-12-10 ENCOUNTER — Encounter: Payer: Self-pay | Admitting: Family Medicine

## 2023-12-10 VITALS — BP 145/79 | HR 94 | Temp 98.4°F | Ht 62.5 in | Wt 208.2 lb

## 2023-12-10 DIAGNOSIS — Z0000 Encounter for general adult medical examination without abnormal findings: Secondary | ICD-10-CM | POA: Diagnosis not present

## 2023-12-10 DIAGNOSIS — E66812 Obesity, class 2: Secondary | ICD-10-CM

## 2023-12-10 DIAGNOSIS — Z1231 Encounter for screening mammogram for malignant neoplasm of breast: Secondary | ICD-10-CM

## 2023-12-10 DIAGNOSIS — R252 Cramp and spasm: Secondary | ICD-10-CM

## 2023-12-10 DIAGNOSIS — I10 Essential (primary) hypertension: Secondary | ICD-10-CM | POA: Diagnosis not present

## 2023-12-10 DIAGNOSIS — Z6837 Body mass index (BMI) 37.0-37.9, adult: Secondary | ICD-10-CM

## 2023-12-10 DIAGNOSIS — R7303 Prediabetes: Secondary | ICD-10-CM

## 2023-12-10 MED ORDER — METFORMIN HCL ER 500 MG PO TB24: 500.0000 mg | ORAL_TABLET | Freq: Every day | ORAL | 3 refills | Status: DC

## 2023-12-10 NOTE — Patient Instructions (Addendum)
Take both blood pressure medicines more regularly   A flu shot is a good idea if it is available to you   Some vitamin D (1000 international units ) daily is a good idea for bone and overall health   Get back on metformin regularly   Stay active  Add some strength training to your routine, this is important for bone and brain health and can reduce your risk of falls and help your body use insulin properly and regulate weight  Light weights, exercise bands , and internet videos are a good way to start  Yoga (chair or regular), machines , floor exercises or a gym with machines are also good options   Consider stretching program for your legs (primarily calves)   Read about gabapentin (nerve pain medicine)- it may help your cramping  If you want to try it let us know   Labs today   Follow up in 2-3 weeks to check blood pressure

## 2023-12-10 NOTE — Assessment & Plan Note (Signed)
Reviewed health habits including diet and exercise and skin cancer prevention Reviewed appropriate screening tests for age  Also reviewed health mt list, fam hx and immunization status , as well as social and family history   See HPI Labs reviewed and ordered Plans to get flu shot at her work place  Mammogram planned for 12/2023 Gyn visit planned for 12/2023  Declines std screening today  Colonoscopy utd 2020 with 10 y recall Discussed fall prevention, supplements and exercise for bone density  PHQ 1

## 2023-12-10 NOTE — Assessment & Plan Note (Signed)
A1c today Encouraged to get back on metformin xr 500 mg daily for glucose control and to help weight loss  disc imp of low glycemic diet and wt loss to prevent DM2

## 2023-12-10 NOTE — Assessment & Plan Note (Signed)
Pt has mammogram scheduled at gyn office next month Had negative bx in 12/2022

## 2023-12-10 NOTE — Progress Notes (Signed)
Subjective:    Patient ID: [name], female    DOB: [birth year], 47 y.o.   MRN: [id number]  HPI  Here for health maintenance exam and to review chronic medical problems   Wt Readings from Last 3 Encounters:  12/10/23 208 lb 4 oz (94.5 kg)  02/15/23 219 lb 2 oz (99.4 kg)  01/01/23 223 lb 8 oz (101.4 kg)   37.48 kg/m  Vitals:   12/10/23 1455 12/10/23 1524  BP: (!) 156/78 (!) 145/79  Pulse: 94   Temp: 98.4 F (36.9 C)   SpO2: 96%     Immunization History  Administered Date(s) Administered   Influenza,inj,Quad PF,6+ Mos 01/03/2016, 11/24/2016, 10/12/2021, 08/11/2022   Influenza-Unspecified 09/25/2014   PFIZER Comirnaty(Gray Top)Covid-19 Tri-Sucrose Vaccine 12/02/2020, 12/23/2020   Td 12/26/1991, 12/04/2007, 12/05/2007   Tdap 11/27/2017    There are no preventive care reminders to display for this patient.  Despite work issues/ stress Doing ok overall    Flu shot - may get at her job    Mammogram 12/2022 - ended up needing a biopsy (fibrosis and calcifications/no cancer)  She has the next one scheduled  Self breast exam -no lumps   Gyn health Pap 12/2022  normal with neg HPV and neg std screen  Had a partial hysterectomy   Declines STD screening -does it at gyn    Colon cancer screening -colonoscopy 03/2019 with 10 y recalll  Bone health   Falls- none  Fractures-none  Supplements - takes vitamin D here and there    Exercise :  Elliptical  Light weights  Walking   Did loose some weight  Walking more at work and also on a vacation     Mood    12/10/2023    3:35 PM 02/15/2023    9:40 AM 12/05/2022    4:49 PM 12/01/2020    5:00 PM 12/03/2019    3:51 PM  Depression screen PHQ 2/9  Decreased Interest 0 0 0 0 0  Down, Depressed, Hopeless 0 0 0 0 0  PHQ - 2 Score 0 0 0 0 0  Altered sleeping 1 0 1 1 0  Tired, decreased energy 1 1 1 1 1   Change in appetite 0 0 0 0 0  Feeling bad or failure about yourself  0 0 0 0 0  Trouble  concentrating 0 0 0 0 0  Moving slowly or fidgety/restless 0 0 0 0 0  Suicidal thoughts 0 0 0 0 0  PHQ-9 Score 2 1 2 2 1   Difficult doing work/chores Not difficult at all Not difficult at all Not difficult at all Not difficult at all Not difficult at all   Work has become more stressful  [employer] Eliminated a lot of positions  More work for her / new dept    HTN bp is stable today  No cp or palpitations or headaches or edema  No side effects to medicines  BP Readings from Last 3 Encounters:  12/10/23 (!) 145/79  02/15/23 138/80  01/01/23 (!) 140/70    Losartan 50 mg daily  Hydrochlorothiazide 12.5 mg daily - does not always take   Had a headache for several days- ? If from blood pressure or constipation (that happens occasionally)  Slight headache today   Eating is less regular    Lab Results  Component Value Date   NA 137 12/01/2022   K 3.8 12/01/2022   CO2 22 12/01/2022   GLUCOSE 122 (H) 12/01/2022   BUN 9  12/01/2022   CREATININE 0.79 12/01/2022   CALCIUM 9.2 12/01/2022   EGFR 93 12/01/2022   GFRNONAA >60 08/10/2022   Due for labs  Prediabetes Lab Results  Component Value Date   HGBA1C 6.1 08/10/2023   Has not been taking metformin every day regularly  Wants to get back to that  Tolerates metformin xr      Patient Active Problem List   Diagnosis Date Noted   Dermatitis 02/15/2023   Encounter for screening mammogram for breast cancer 01/01/2023   Routine general medical examination at a health care facility 12/05/2022   Onychomycosis 12/05/2022   Leg cramp 12/05/2022   Tension headache 08/25/2022   Tinea pedis 12/01/2020   Class 2 severe obesity due to excess calories with serious comorbidity and body mass index (BMI) of 37.0 to 37.9 in adult (HCC) 12/03/2019   Hematuria, microscopic 03/04/2019   Prediabetes 11/27/2017   Screen for STD (sexually transmitted disease) 11/24/2016   Sleep apnea 01/03/2016   Essential hypertension 09/16/2012    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   Allergic rhinitis 07/02/2007   GERD 07/02/2007   Past Medical History:  Diagnosis Date   Abnormal Pap smear    cin-1   Allergic rhinitis    seasonal   Allergy    Diabetes mellitus without complication (HCC)    boarderline per pt   GERD (gastroesophageal reflux disease)    Herpes simplex without mention of complication    hsv-2   Hypertension    Past Surgical History:  Procedure Laterality Date   BREAST BIOPSY Right 02/12/2023   stereo bx, calcs, "X" clip-path pending   BREAST BIOPSY Right 02/12/2023   MM RT BREAST BX W LOC DEV 1ST LESION IMAGE BX SPEC STEREO GUIDE 02/12/2023 [facility]   CERVIX LESION DESTRUCTION     COLPOSCOPY  1997   abnormal pap   LAMINECTOMY AND MICRODISCECTOMY LUMBAR SPINE  12/2007   L5-S1   PARTIAL HYSTERECTOMY  11/2005   bleeding   WISDOM TOOTH EXTRACTION     Social History   Tobacco Use   Smoking status: Never   Smokeless tobacco: Never  Vaping Use   Vaping status: Never Used  Substance Use Topics   Alcohol use: Yes    Alcohol/week: 0.0 standard drinks of alcohol    Comment: social   Drug use: No   Family History  Problem Relation Age of Onset   Hypertension Mother 62   Hypertension Father    Diabetes Paternal Grandfather    Breast cancer Maternal Grandmother    Colon cancer Other        M.Great aunt   Stroke Paternal Grandmother    Stomach cancer Neg Hx    Pancreatic cancer Neg Hx    Esophageal cancer Neg Hx    Rectal cancer Neg Hx    Allergies  Allergen Reactions   Lisinopril     Cough    Morphine     REACTION: skin crawling   Oxycodone Itching   Rofecoxib     REACTION: blurred vision, dizziness   Current Outpatient Medications on File Prior to Visit  Medication Sig Dispense Refill   fluticasone (FLONASE) 50 MCG/ACT nasal spray Place 2 sprays into both nostrils daily. 48 g 3   hydrochlorothiazide (HYDRODIURIL) 12.5 MG tablet Take 1 tablet (12.5 mg total) by mouth daily. for  blood pressure. 90 tablet 3   ketoconazole (NIZORAL) 2 % cream Apply 1 application topically 2 (two) times daily. 15 g 3   losartan (COZAAR) 50  MG tablet Take 1 tablet (50 mg total) by mouth daily. 90 tablet 3   Multiple Vitamin (MULTIVITAMIN) capsule Take 1 capsule by mouth daily.     terbinafine (LAMISIL) 250 MG tablet Take 1 tablet (250 mg total) by mouth daily. 90 tablet 0   triamcinolone cream (KENALOG) 0.1 % Apply 1 Application topically 2 (two) times daily. 30 g 0   valACYclovir (VALTREX) 1000 MG tablet TAKE ONE-HALF TABLET BY MOUTH  TWICE DAILY AS NEEDED 90 tablet 0   methocarbamol (ROBAXIN) 500 MG tablet Take 1 tablet (500 mg total) by mouth every 8 (eight) hours as needed for muscle spasms (neck pain and headache). Caution of sedation (Patient not taking: Reported on 12/10/2023) 30 tablet 1   No current facility-administered medications on file prior to visit.    Review of Systems  Constitutional:  Negative for activity change, appetite change, fatigue, fever and unexpected weight change.  HENT:  Negative for congestion, ear pain, rhinorrhea, sinus pressure and sore throat.   Eyes:  Negative for pain, redness and visual disturbance.  Respiratory:  Negative for cough, shortness of breath and wheezing.   Cardiovascular:  Negative for chest pain and palpitations.  Gastrointestinal:  Negative for abdominal pain, blood in stool, constipation and diarrhea.  Endocrine: Negative for polydipsia and polyuria.  Genitourinary:  Negative for dysuria, frequency and urgency.  Musculoskeletal:  Positive for myalgias. Negative for arthralgias and back pain.       Right leg cramps frequently / in calf  Day or night   Skin:  Negative for pallor and rash.  Allergic/Immunologic: Negative for environmental allergies.  Neurological:  Negative for dizziness, syncope and headaches.  Hematological:  Negative for adenopathy. Does not bruise/bleed easily.  Psychiatric/Behavioral:  Negative for decreased  concentration and dysphoric mood. The patient is not nervous/anxious.        Objective:   Physical Exam Constitutional:      General: She is not in acute distress.    Appearance: Normal appearance. She is well-developed. She is obese. She is not ill-appearing or diaphoretic.  HENT:     Head: Normocephalic and atraumatic.     Right Ear: Tympanic membrane, ear canal and external ear normal.     Left Ear: Tympanic membrane, ear canal and external ear normal.     Nose: Nose normal. No congestion.     Mouth/Throat:     Mouth: Mucous membranes are moist.     Pharynx: Oropharynx is clear. No posterior oropharyngeal erythema.  Eyes:     General: No scleral icterus.    Extraocular Movements: Extraocular movements intact.     Conjunctiva/sclera: Conjunctivae normal.     Pupils: Pupils are equal, round, and reactive to light.  Neck:     Thyroid: No thyromegaly.     Vascular: No carotid bruit or JVD.  Cardiovascular:     Rate and Rhythm: Normal rate and regular rhythm.     Pulses: Normal pulses.     Heart sounds: Normal heart sounds.     No gallop.  Pulmonary:     Effort: Pulmonary effort is normal. No respiratory distress.     Breath sounds: Normal breath sounds. No wheezing.     Comments: Good air exch Chest:     Chest wall: No tenderness.  Abdominal:     General: Bowel sounds are normal. There is no distension or abdominal bruit.     Palpations: Abdomen is soft. There is no mass.     Tenderness: There is no  abdominal tenderness.     Hernia: No hernia is present.  Genitourinary:    Comments: Breast and pelvic exam are done by gyn provider   Musculoskeletal:        General: No tenderness. Normal range of motion.     Cervical back: Normal range of motion and neck supple. No rigidity. No muscular tenderness.     Right lower leg: No edema.     Left lower leg: No edema.     Comments: No kyphosis   No LE tenderness Well perfused LEs   Lymphadenopathy:     Cervical: No cervical  adenopathy.  Skin:    General: Skin is warm and dry.     Coloration: Skin is not pale.     Findings: No erythema or rash.     Comments: Some scattered tags   Neurological:     Mental Status: She is alert. Mental status is at baseline.     Cranial Nerves: No cranial nerve deficit.     Motor: No abnormal muscle tone.     Coordination: Coordination normal.     Gait: Gait normal.     Deep Tendon Reflexes: Reflexes are normal and symmetric. Reflexes normal.  Psychiatric:        Mood and Affect: Mood normal.        Cognition and Memory: Cognition and memory normal.           Assessment & Plan:   Problem List Items Addressed This Visit       Cardiovascular and Mediastinum   Essential hypertension   bp is not at goal today BP Readings from Last 1 Encounters:  12/10/23 (!) 145/79   Encouraged pt to start taking hydrochlorothiazide 12.5 mg daily (instead of prn)  Continue losartan 50 mg daily Most recent labs reviewed  Labs ordered today Disc lifstyle change with low sodium diet and exercise   Follow up 2-3 weeks If not improved on both medicines consider increase in therapy       Relevant Orders   TSH   Lipid Panel   Comprehensive metabolic panel   CBC with Differential/Platelet     Other   Routine general medical examination at a health care facility - Primary   Reviewed health habits including diet and exercise and skin cancer prevention Reviewed appropriate screening tests for age  Also reviewed health mt list, fam hx and immunization status , as well as social and family history   See HPI Labs reviewed and ordered Plans to get flu shot at her work place  Mammogram planned for 12/2023 Gyn visit planned for 12/2023  Declines std screening today  Colonoscopy utd 2020 with 10 y recall Discussed fall prevention, supplements and exercise for bone density  PHQ 1       Prediabetes   A1c today Encouraged to get back on metformin xr 500 mg daily for glucose  control and to help weight loss  disc imp of low glycemic diet and wt loss to prevent DM2         Relevant Orders   Hemoglobin A1c   Leg cramp   Pt has dealt with frequent RLE leg cramps since her back surgery  Has tried magnesium- not very effective Discussed starting a stretching program for LEs   Labs today Discussed gabapentin as an option Info given  Will let us know if interested      Encounter for screening mammogram for breast cancer   Pt has mammogram scheduled at gyn  office next month Had negative bx in 12/2022        Class 2 severe obesity due to excess calories with serious comorbidity and body mass index (BMI) of 37.0 to 37.9 in adult [facility])   Discussed how this problem influences overall health and the risks it imposes  Reviewed plan for weight loss with lower calorie diet (via better food choices (lower glycemic and portion control) along with exercise building up to or more than 30 minutes 5 days per week including some aerobic activity and strength training   Encouraged to start back metformin xr 500 mg daily        Relevant Medications   metFORMIN (GLUCOPHAGE-XR) 500 MG 24 hr tablet

## 2023-12-10 NOTE — Assessment & Plan Note (Signed)
bp is not at goal today BP Readings from Last 1 Encounters:  12/10/23 (!) 145/79   Encouraged pt to start taking hydrochlorothiazide 12.5 mg daily (instead of prn)  Continue losartan 50 mg daily Most recent labs reviewed  Labs ordered today Disc lifstyle change with low sodium diet and exercise   Follow up 2-3 weeks If not improved on both medicines consider increase in therapy

## 2023-12-10 NOTE — Assessment & Plan Note (Addendum)
Discussed how this problem influences overall health and the risks it imposes  Reviewed plan for weight loss with lower calorie diet (via better food choices (lower glycemic and portion control) along with exercise building up to or more than 30 minutes 5 days per week including some aerobic activity and strength training   Encouraged to start back metformin xr 500 mg daily

## 2023-12-10 NOTE — Assessment & Plan Note (Signed)
Pt has dealt with frequent RLE leg cramps since her back surgery  Has tried magnesium- not very effective Discussed starting a stretching program for LEs   Labs today Discussed gabapentin as an option Info given  Will let us know if interested

## 2023-12-11 LAB — CBC WITH DIFFERENTIAL/PLATELET
Basophils Absolute: 0 10*3/uL (ref 0.0–0.2)
Basos: 1 %
EOS (ABSOLUTE): 0.1 10*3/uL (ref 0.0–0.4)
Eos: 2 %
Hematocrit: 40.3 % (ref 34.0–46.6)
Hemoglobin: 13.3 g/dL (ref 11.1–15.9)
Immature Grans (Abs): 0 10*3/uL (ref 0.0–0.1)
Immature Granulocytes: 0 %
Lymphocytes Absolute: 1.5 10*3/uL (ref 0.7–3.1)
Lymphs: 26 %
MCH: 29.7 pg (ref 26.6–33.0)
MCHC: 33 g/dL (ref 31.5–35.7)
MCV: 90 fL (ref 79–97)
Monocytes Absolute: 0.4 10*3/uL (ref 0.1–0.9)
Monocytes: 6 %
Neutrophils Absolute: 3.9 10*3/uL (ref 1.4–7.0)
Neutrophils: 65 %
Platelets: 247 10*3/uL (ref 150–450)
RBC: 4.48 x10E6/uL (ref 3.77–5.28)
RDW: 11.9 % (ref 11.7–15.4)
WBC: 6 10*3/uL (ref 3.4–10.8)

## 2023-12-11 LAB — TSH: TSH: 3.08 u[IU]/mL (ref 0.450–4.500)

## 2023-12-11 LAB — COMPREHENSIVE METABOLIC PANEL
ALT: 11 [IU]/L (ref 0–32)
AST: 15 [IU]/L (ref 0–40)
Albumin: 4.4 g/dL (ref 3.9–4.9)
Alkaline Phosphatase: 64 [IU]/L (ref 44–121)
BUN/Creatinine Ratio: 11 (ref 9–23)
BUN: 8 mg/dL (ref 6–24)
Bilirubin Total: 0.5 mg/dL (ref 0.0–1.2)
CO2: 22 mmol/L (ref 20–29)
Calcium: 9.1 mg/dL (ref 8.7–10.2)
Chloride: 103 mmol/L (ref 96–106)
Creatinine, Ser: 0.74 mg/dL (ref 0.57–1.00)
Globulin, Total: 2.5 g/dL (ref 1.5–4.5)
Glucose: 139 mg/dL — ABNORMAL HIGH (ref 70–99)
Potassium: 3.6 mmol/L (ref 3.5–5.2)
Sodium: 138 mmol/L (ref 134–144)
Total Protein: 6.9 g/dL (ref 6.0–8.5)
eGFR: 100 mL/min/{1.73_m2} (ref >59)

## 2023-12-11 LAB — HEMOGLOBIN A1C
Est. average glucose Bld gHb Est-mCnc: 123 mg/dL
Hgb A1c MFr Bld: 5.9 % — ABNORMAL HIGH (ref 4.8–5.6)

## 2023-12-11 LAB — LIPID PANEL
Chol/HDL Ratio: 2.8 {ratio} (ref 0.0–4.4)
Cholesterol, Total: 167 mg/dL (ref 100–199)
HDL: 59 mg/dL (ref >39)
LDL Chol Calc (NIH): 88 mg/dL (ref 0–99)
Triglycerides: 112 mg/dL (ref 0–149)
VLDL Cholesterol Cal: 20 mg/dL (ref 5–40)

## 2023-12-21 ENCOUNTER — Encounter: Payer: Self-pay | Admitting: Family Medicine

## 2023-12-21 ENCOUNTER — Ambulatory Visit: Payer: Managed Care, Other (non HMO) | Admitting: Family Medicine

## 2023-12-21 VITALS — BP 129/65 | HR 81 | Temp 98.7°F | Ht 62.5 in | Wt 209.5 lb

## 2023-12-21 DIAGNOSIS — R7303 Prediabetes: Secondary | ICD-10-CM

## 2023-12-21 DIAGNOSIS — I10 Essential (primary) hypertension: Secondary | ICD-10-CM

## 2023-12-21 MED ORDER — LOSARTAN POTASSIUM 50 MG PO TABS: 50.0000 mg | ORAL_TABLET | Freq: Every day | ORAL | 3 refills | Status: DC

## 2023-12-21 NOTE — Assessment & Plan Note (Signed)
bp in fair control at this time  BP Readings from Last 1 Encounters:  12/21/23 129/65   No changes needed Most recent labs reviewed  Disc lifstyle change with low sodium diet and exercise  Improved with hydrochlorothiazide daily instead of prn 12.5 mg  Continue losartan 50 mg daily   Lab today

## 2023-12-21 NOTE — Assessment & Plan Note (Signed)
Lab Results  Component Value Date   HGBA1C 5.9 (H) 12/10/2023   This did improve Encouraged to continue metformin xr 500 mg daily  Also low glycemic diet and exercise   Bmet today

## 2023-12-21 NOTE — Patient Instructions (Addendum)
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

## 2023-12-21 NOTE — Progress Notes (Signed)
Subjective:    Patient ID: [name], female    DOB: 1976/12/24, 47 y.o.   MRN: [id number]  HPI  Wt Readings from Last 3 Encounters:  12/21/23 209 lb 8 oz (95 kg)  12/10/23 208 lb 4 oz (94.5 kg)  02/15/23 219 lb 2 oz (99.4 kg)   37.71 kg/m  Vitals:   12/21/23 1559 12/21/23 1610  BP: 136/82 129/65  Pulse: 81   Temp: 98.7 F (37.1 C)   SpO2: 96%     Pt presents for follow up of HTN and chronic health problems   HTN bp is stable today  No cp or palpitations or headaches or edema  No side effects to medicines  BP Readings from Last 3 Encounters:  12/21/23 129/65  12/10/23 (!) 145/79  02/15/23 138/80    Last visit instructed to take hydrochlorothiazide 12.5 mg daily instead of prn  Does urinate more often  Losartan 50 mg daily    Still having some headaches  Tension in back of neck   Lab Results  Component Value Date   NA 138 12/10/2023   K 3.6 12/10/2023   CO2 22 12/10/2023   GLUCOSE 139 (H) 12/10/2023   BUN 8 12/10/2023   CREATININE 0.74 12/10/2023   CALCIUM 9.1 12/10/2023   EGFR 100 12/10/2023   GFRNONAA >60 08/10/2022   Prediabetes Lab Results  Component Value Date   HGBA1C 5.9 (H) 12/10/2023   Last visit instructed to get back on metformin xr 500 mg daily  Decided to take that at night    Discussed option of gabapentin for leg cramps  Wants to hold off on that for now / trying stretching      Past Medical History:  Diagnosis Date   Abnormal Pap smear    cin-1   Allergic rhinitis    seasonal   Allergy    Diabetes mellitus without complication (HCC)    boarderline per pt   GERD (gastroesophageal reflux disease)    Herpes simplex without mention of complication    hsv-2   Hypertension    Past Surgical History:  Procedure Laterality Date   BREAST BIOPSY Right 02/12/2023   stereo bx, calcs, "X" clip-path pending   BREAST BIOPSY Right 02/12/2023   MM RT BREAST BX W LOC DEV 1ST LESION IMAGE BX SPEC STEREO GUIDE  02/12/2023 [facility]   CERVIX LESION DESTRUCTION     COLPOSCOPY  1997   abnormal pap   LAMINECTOMY AND MICRODISCECTOMY LUMBAR SPINE  12/2007   L5-S1   PARTIAL HYSTERECTOMY  11/2005   bleeding   WISDOM TOOTH EXTRACTION     Social History   Tobacco Use   Smoking status: Never   Smokeless tobacco: Never  Vaping Use   Vaping status: Never Used  Substance Use Topics   Alcohol use: Yes    Alcohol/week: 0.0 standard drinks of alcohol    Comment: social   Drug use: No   Family History  Problem Relation Age of Onset   Hypertension Mother 41   Hypertension Father    Diabetes Paternal Grandfather    Breast cancer Maternal Grandmother    Colon cancer Other        M.Great aunt   Stroke Paternal Grandmother    Stomach cancer Neg Hx    Pancreatic cancer Neg Hx    Esophageal cancer Neg Hx    Rectal cancer Neg Hx    Allergies  Allergen Reactions   Lisinopril     Cough  Morphine     REACTION: skin crawling   Oxycodone Itching   Rofecoxib     REACTION: blurred vision, dizziness   Current Outpatient Medications on File Prior to Visit  Medication Sig Dispense Refill   fluticasone (FLONASE) 50 MCG/ACT nasal spray Place 2 sprays into both nostrils daily. 48 g 3   hydrochlorothiazide (HYDRODIURIL) 12.5 MG tablet Take 1 tablet (12.5 mg total) by mouth daily. for blood pressure. 90 tablet 3   ketoconazole (NIZORAL) 2 % cream Apply 1 application topically 2 (two) times daily. 15 g 3   metFORMIN (GLUCOPHAGE-XR) 500 MG 24 hr tablet Take 1 tablet (500 mg total) by mouth daily with breakfast. 90 tablet 3   methocarbamol (ROBAXIN) 500 MG tablet Take 1 tablet (500 mg total) by mouth every 8 (eight) hours as needed for muscle spasms (neck pain and headache). Caution of sedation 30 tablet 1   Multiple Vitamin (MULTIVITAMIN) capsule Take 1 capsule by mouth daily.     terbinafine (LAMISIL) 250 MG tablet Take 1 tablet (250 mg total) by mouth daily. 90 tablet 0   triamcinolone cream  (KENALOG) 0.1 % Apply 1 Application topically 2 (two) times daily. 30 g 0   valACYclovir (VALTREX) 1000 MG tablet TAKE ONE-HALF TABLET BY MOUTH  TWICE DAILY AS NEEDED 90 tablet 0   No current facility-administered medications on file prior to visit.    Review of Systems  Constitutional:  Negative for activity change, appetite change, fatigue, fever and unexpected weight change.       Wants to increase her metabolism  HENT:  Negative for congestion, ear pain, rhinorrhea, sinus pressure and sore throat.   Eyes:  Negative for pain, redness and visual disturbance.  Respiratory:  Negative for cough, shortness of breath and wheezing.   Cardiovascular:  Negative for chest pain and palpitations.  Gastrointestinal:  Negative for abdominal pain, blood in stool, constipation and diarrhea.  Endocrine: Negative for polydipsia and polyuria.  Genitourinary:  Negative for dysuria, frequency and urgency.  Musculoskeletal:  Negative for arthralgias, back pain and myalgias.  Skin:  Negative for pallor and rash.  Allergic/Immunologic: Negative for environmental allergies.  Neurological:  Negative for dizziness, syncope and headaches.  Hematological:  Negative for adenopathy. Does not bruise/bleed easily.  Psychiatric/Behavioral:  Negative for decreased concentration and dysphoric mood. The patient is not nervous/anxious.        Objective:   Physical Exam Constitutional:      General: She is not in acute distress.    Appearance: Normal appearance. She is well-developed. She is obese. She is not ill-appearing or diaphoretic.  HENT:     Head: Normocephalic and atraumatic.  Eyes:     Conjunctiva/sclera: Conjunctivae normal.     Pupils: Pupils are equal, round, and reactive to light.  Neck:     Thyroid: No thyromegaly.     Vascular: No carotid bruit or JVD.  Cardiovascular:     Rate and Rhythm: Normal rate and regular rhythm.     Heart sounds: Normal heart sounds.     No gallop.  Pulmonary:      Effort: Pulmonary effort is normal. No respiratory distress.     Breath sounds: Normal breath sounds. No wheezing or rales.  Abdominal:     General: There is no distension or abdominal bruit.     Palpations: Abdomen is soft.  Musculoskeletal:     Cervical back: Normal range of motion and neck supple.     Right lower leg: No edema.  Left lower leg: No edema.  Lymphadenopathy:     Cervical: No cervical adenopathy.  Skin:    General: Skin is warm and dry.     Coloration: Skin is not pale.     Findings: No rash.  Neurological:     Mental Status: She is alert.     Coordination: Coordination normal.     Deep Tendon Reflexes: Reflexes are normal and symmetric. Reflexes normal.  Psychiatric:        Mood and Affect: Mood normal.           Assessment & Plan:   Problem List Items Addressed This Visit       Cardiovascular and Mediastinum   Essential hypertension - Primary   bp in fair control at this time  BP Readings from Last 1 Encounters:  12/21/23 129/65   No changes needed Most recent labs reviewed  Disc lifstyle change with low sodium diet and exercise  Improved with hydrochlorothiazide daily instead of prn 12.5 mg  Continue losartan 50 mg daily   Lab today        Relevant Medications   losartan (COZAAR) 50 MG tablet   Other Relevant Orders   Basic metabolic panel     Other   Prediabetes   Lab Results  Component Value Date   HGBA1C 5.9 (H) 12/10/2023   This did improve Encouraged to continue metformin xr 500 mg daily  Also low glycemic diet and exercise   Bmet today

## 2023-12-22 LAB — BASIC METABOLIC PANEL
BUN/Creatinine Ratio: 15 (ref 9–23)
BUN: 12 mg/dL (ref 6–24)
CO2: 23 mmol/L (ref 20–29)
Calcium: 9.3 mg/dL (ref 8.7–10.2)
Chloride: 100 mmol/L (ref 96–106)
Creatinine, Ser: 0.81 mg/dL (ref 0.57–1.00)
Glucose: 134 mg/dL — ABNORMAL HIGH (ref 70–99)
Potassium: 3.5 mmol/L (ref 3.5–5.2)
Sodium: 137 mmol/L (ref 134–144)
eGFR: 90 mL/min/{1.73_m2} (ref >59)

## 2024-02-24 ENCOUNTER — Other Ambulatory Visit: Payer: Self-pay | Admitting: Family Medicine

## 2024-02-24 DIAGNOSIS — I10 Essential (primary) hypertension: Secondary | ICD-10-CM

## 2024-02-26 LAB — HM MAMMOGRAPHY

## 2024-08-07 ENCOUNTER — Ambulatory Visit: Payer: Self-pay

## 2024-08-07 NOTE — Telephone Encounter (Signed)
 Will see patient then Agree with ER and UC precautions

## 2024-08-07 NOTE — Telephone Encounter (Signed)
 Patient called back and was able to be scheduled for 8/15 at 10 am with PCP      Reason for Disposition  [1] SEVERE back pain (e.g., excruciating, unable to do any normal activities) AND [2] not improved 2 hours after pain medicine  Protocols used: Back Pain-A-AH

## 2024-08-07 NOTE — Telephone Encounter (Signed)
 Call dropped twice during triage. Was able to call patient back the first time and complete most of triage.  While asking if patient experiences numbness or weakness, patient was speaking and audio cut out. Call dropped. Attempted twice to call patient back and it states voicemail box is not available. Routing to call back for assessment of numbness/weakness/problems with bowel or bladder control and any additional symptoms. Patient was requesting if she can make an appointment with her provider.    Copied from CRM #[id number]. Topic: Clinical - Red Word Triage >> Aug 07, 2024  3:46 PM Dedra B wrote: Red Word that prompted transfer to Nurse Triage: Pt calling in severe pain on right side radiating from lower back to right hip and leg. On a scale of 1-10, pain is at a 20. Pt can barely walk. Warm transfer to e2c2 nurse triage. Answer Assessment - Initial Assessment Questions 1. ONSET: When did the pain begin? (e.g., minutes, hours, days)     Couple weeks.  2. LOCATION: Where does it hurt? (upper, mid or lower back)     Lower.  3. SEVERITY: How bad is the pain?  (e.g., Scale 1-10; mild, moderate, or severe)     Severe.  4. PATTERN: Is the pain constant? (e.g., yes, no; constant, intermittent)      Yes, constant.  5. RADIATION: Does the pain shoot into your legs or somewhere else?     Right hip and leg.  6. CAUSE:  What do you think is causing the back pain?      She states she went on a beach trip and she thought maybe it was from sleeping on a different bed. She also states she had to sit in a different chair at her work place that was not supportive. Patient states she has a history of 2 back surgeries and she suffers from sciatica. She states this is not the same when she had a ruptured disc.  7. BACK OVERUSE:  Any recent lifting of heavy objects, strenuous work or exercise?     No.  8. MEDICINES: What have you taken so far for the pain? (e.g., nothing, acetaminophen,  NSAIDS)     Ibuprofen, Tylenol, she also states her mother had an ointment she used.  9. NEUROLOGIC SYMPTOMS: Do you have any weakness, numbness, or problems with bowel/bladder control?     She states if she is sleeping on her right side she will experience numbness. Did not get to assess weakness or bowel/bladder problems due to call dropped again.  10. OTHER SYMPTOMS: Do you have any other symptoms? (e.g., fever, abdomen pain, burning with urination, blood in urine)       Denies any recent falls or back injury, fever. Unable to ask for any additional symptoms due to call dropped.  11. PREGNANCY: Is there any chance you are pregnant? When was your last menstrual period?       *No Answer*  Protocols used: Back Pain-A-AH

## 2024-08-08 ENCOUNTER — Ambulatory Visit: Admitting: Family Medicine

## 2024-08-08 ENCOUNTER — Encounter: Payer: Self-pay | Admitting: Family Medicine

## 2024-08-08 VITALS — BP 138/80 | HR 78 | Temp 98.1°F | Ht 62.5 in | Wt 218.1 lb

## 2024-08-08 DIAGNOSIS — M5441 Lumbago with sciatica, right side: Secondary | ICD-10-CM | POA: Diagnosis not present

## 2024-08-08 MED ORDER — TIZANIDINE HCL 4 MG PO TABS: 4.0000 mg | ORAL_TABLET | Freq: Three times a day (TID) | ORAL | 0 refills | Status: DC | PRN

## 2024-08-08 MED ORDER — PREDNISONE 10 MG PO TABS: ORAL_TABLET | ORAL | 0 refills | Status: DC

## 2024-08-08 NOTE — Patient Instructions (Signed)
 Try the rehab stretches /exercises   Try some heat -for 10 minutes at a time   Take prednisone  as directed for inflammation /pain  Try the tizanidine  (muscle relaxer) also as tolerated (caution of sedation)    Update if not starting to improve in a week or if worsening  If any severe symptoms go to the ER

## 2024-08-08 NOTE — Progress Notes (Signed)
 Subjective:    Patient ID: [name], female    DOB: Sep 01, 1976, 48 y.o.   MRN: [id number]  HPI  Wt Readings from Last 3 Encounters:  08/08/24 218 lb 2 oz (98.9 kg)  12/21/23 209 lb 8 oz (95 kg)  12/10/23 208 lb 4 oz (94.5 kg)   39.26 kg/m  Vitals:   08/08/24 1005 08/08/24 1025  BP: (!) 146/94 138/80  Pulse: 78   Temp: 98.1 F (36.7 C)   SpO2: 97%      Pt presents for c/o back pain Started 2 wk ago  Started after sleeping in different bed on vacation   Started during that trip  Getting worse  Pain was across whole lower back  Now shifting more to right side  Does not travel down her leg this time  Sharp pain    Better when  Sitting  Squatting     Worse when  Standing up to move  When lifting feet up in her chair  ? If new chair at work with a low back  Lying on the right side   Walking does not help it   No numbness No loss of BB control No weakness  Does get occational spasms/cramps in RLE     Tried ibuprofen  Tylenol  Menthol ointment     History of laminectomy and microdiscectomy LS L5-S1 in 2009 for herniated lumbar disk      Patient Active Problem List   Diagnosis Date Noted   Dermatitis 02/15/2023   Encounter for screening mammogram for breast cancer 01/01/2023   Routine general medical examination at a health care facility 12/05/2022   Onychomycosis 12/05/2022   Leg cramp 12/05/2022   Tension headache 08/25/2022   Tinea pedis 12/01/2020   Right low back pain 02/05/2020   Class 2 severe obesity due to excess calories with serious comorbidity and body mass index (BMI) of 37.0 to 37.9 in adult (HCC) 12/03/2019   Hematuria, microscopic 03/04/2019   Prediabetes 11/27/2017   Screen for STD (sexually transmitted disease) 11/24/2016   Sleep apnea 01/03/2016   Essential hypertension 09/16/2012   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   Allergic rhinitis 07/02/2007   GERD 07/02/2007   Past Medical History:  Diagnosis  Date   Abnormal Pap smear    cin-1   Allergic rhinitis    seasonal   Allergy    Diabetes mellitus without complication (HCC)    boarderline per pt   GERD (gastroesophageal reflux disease)    Herpes simplex without mention of complication    hsv-2   Hypertension    Past Surgical History:  Procedure Laterality Date   BREAST BIOPSY Right 02/12/2023   stereo bx, calcs, X clip-path pending   BREAST BIOPSY Right 02/12/2023   MM RT BREAST BX W LOC DEV 1ST LESION IMAGE BX SPEC STEREO GUIDE 02/12/2023 [facility]   CERVIX LESION DESTRUCTION     COLPOSCOPY  1997   abnormal pap   LAMINECTOMY AND MICRODISCECTOMY LUMBAR SPINE  12/2007   L5-S1   PARTIAL HYSTERECTOMY  11/2005   bleeding   WISDOM TOOTH EXTRACTION     Social History   Tobacco Use   Smoking status: Never   Smokeless tobacco: Never  Vaping Use   Vaping status: Never Used  Substance Use Topics   Alcohol use: Yes    Alcohol/week: 0.0 standard drinks of alcohol    Comment: social   Drug use: No   Family History  Problem Relation Age of Onset  Hypertension Mother 38   Hypertension Father    Diabetes Paternal Grandfather    Breast cancer Maternal Grandmother    Colon cancer Other        M.Great aunt   Stroke Paternal Grandmother    Stomach cancer Neg Hx    Pancreatic cancer Neg Hx    Esophageal cancer Neg Hx    Rectal cancer Neg Hx    Allergies  Allergen Reactions   Lisinopril      Cough    Morphine     REACTION: skin crawling   Oxycodone Itching   Rofecoxib     REACTION: blurred vision, dizziness   Current Outpatient Medications on File Prior to Visit  Medication Sig Dispense Refill   fluticasone  (FLONASE ) 50 MCG/ACT nasal spray Place 2 sprays into both nostrils daily. 48 g 3   hydrochlorothiazide  (HYDRODIURIL ) 12.5 MG tablet TAKE 1 TABLET BY MOUTH DAILY FOR BLOOD PRESSURE 90 tablet 3   losartan  (COZAAR ) 50 MG tablet Take 1 tablet (50 mg total) by mouth daily. 90 tablet 3   metFORMIN   (GLUCOPHAGE -XR) 500 MG 24 hr tablet Take 1 tablet (500 mg total) by mouth daily with breakfast. 90 tablet 3   Multiple Vitamin (MULTIVITAMIN) capsule Take 1 capsule by mouth daily.     valACYclovir  (VALTREX ) 1000 MG tablet TAKE ONE-HALF TABLET BY MOUTH  TWICE DAILY AS NEEDED 90 tablet 0   No current facility-administered medications on file prior to visit.    Review of Systems  Constitutional:  Negative for activity change, appetite change, fatigue, fever and unexpected weight change.  HENT:  Negative for congestion, ear pain, rhinorrhea, sinus pressure and sore throat.   Eyes:  Negative for pain, redness and visual disturbance.  Respiratory:  Negative for cough, shortness of breath and wheezing.   Cardiovascular:  Negative for chest pain and palpitations.  Gastrointestinal:  Negative for abdominal pain, blood in stool, constipation and diarrhea.  Endocrine: Negative for polydipsia and polyuria.  Genitourinary:  Negative for dysuria, frequency and urgency.  Musculoskeletal:  Positive for back pain. Negative for arthralgias and myalgias.  Skin:  Negative for pallor and rash.  Allergic/Immunologic: Negative for environmental allergies.  Neurological:  Negative for dizziness, syncope and headaches.  Hematological:  Negative for adenopathy. Does not bruise/bleed easily.  Psychiatric/Behavioral:  Negative for decreased concentration and dysphoric mood. The patient is not nervous/anxious.        Objective:   Physical Exam Constitutional:      General: She is not in acute distress.    Appearance: Normal appearance. She is well-developed. She is obese. She is not ill-appearing or diaphoretic.  HENT:     Head: Normocephalic and atraumatic.  Eyes:     General: No scleral icterus.    Conjunctiva/sclera: Conjunctivae normal.     Pupils: Pupils are equal, round, and reactive to light.  Cardiovascular:     Rate and Rhythm: Normal rate and regular rhythm.  Pulmonary:     Effort: Pulmonary  effort is normal.     Breath sounds: Normal breath sounds. No wheezing or rales.  Abdominal:     General: Bowel sounds are normal. There is no distension.     Palpations: Abdomen is soft.     Tenderness: There is no abdominal tenderness.  Musculoskeletal:        General: Tenderness present.     Cervical back: Normal range of motion and neck supple.     Lumbar back: Spasms and tenderness present. No swelling, edema, deformity or bony  tenderness. Decreased range of motion. Negative right straight leg raise test and negative left straight leg raise test. No scoliosis.     Comments: Midline lumbar scar noted  Tenderness in right low lumbar and SI area (musculature is tight here)  Normal rom of spine-pain with flex to 90 deg  Normal lateral bend Neg slr  No neuro changes   Lymphadenopathy:     Cervical: No cervical adenopathy.  Skin:    General: Skin is warm and dry.     Coloration: Skin is not pale.     Findings: No erythema or rash.  Neurological:     Mental Status: She is alert.     Cranial Nerves: No cranial nerve deficit.     Sensory: No sensory deficit.     Motor: No weakness, atrophy or abnormal muscle tone.     Coordination: Coordination normal.     Deep Tendon Reflexes: Reflexes are normal and symmetric. Reflexes normal.     Comments: Negative SLR  Psychiatric:        Mood and Affect: Mood normal.           Assessment & Plan:   Problem List Items Addressed This Visit       Other   Right low back pain - Primary   In pt with history of DDD and past discectomy  Today exam consistent with muscle pain and spasm in right lumbar and SI area  Overall reassuring  No neuro deficits and no radicular symptoms  Not improved with nsaid    Prescription prednisone  (reviewed side effects)  Tizanadine for spasm with caution of sedation  Handout with rehab stretches/exercises to try   Update if not starting to improve in a week or if worsening  Call back and Er precautions  noted in detail today        Relevant Medications   predniSONE  (DELTASONE ) 10 MG tablet   tiZANidine  (ZANAFLEX ) 4 MG tablet

## 2024-08-08 NOTE — Assessment & Plan Note (Signed)
 In pt with history of DDD and past discectomy  Today exam consistent with muscle pain and spasm in right lumbar and SI area  Overall reassuring  No neuro deficits and no radicular symptoms  Not improved with nsaid    Prescription prednisone  (reviewed side effects)  Tizanadine for spasm with caution of sedation  Handout with rehab stretches/exercises to try   Update if not starting to improve in a week or if worsening  Call back and Er precautions noted in detail today

## 2024-11-11 ENCOUNTER — Telehealth: Payer: Self-pay | Admitting: *Deleted

## 2024-11-11 DIAGNOSIS — I10 Essential (primary) hypertension: Secondary | ICD-10-CM

## 2024-11-11 DIAGNOSIS — R7303 Prediabetes: Secondary | ICD-10-CM

## 2024-11-11 NOTE — Telephone Encounter (Signed)
 Copied from CRM [id number]. Topic: Clinical - Request for Lab/Test Order >> Nov 11, 2024 11:04 AM Thersia BROCKS wrote: Reason for CRM: Patient called in regarding labs for her annual physical, stated she usually gets them sent to [facility], would like a call once those orders have been sent in

## 2024-11-11 NOTE — Telephone Encounter (Signed)
 I put the orders in  Please release  Thanks

## 2024-11-12 NOTE — Telephone Encounter (Signed)
 Pt notified labs sent to [facility] and get them around 1 week prior to CPE

## 2024-12-13 ENCOUNTER — Ambulatory Visit: Payer: Self-pay | Admitting: Family Medicine

## 2024-12-13 LAB — CBC WITH DIFFERENTIAL/PLATELET
Basophils Absolute: 0 x10E3/uL (ref 0.0–0.2)
Basos: 1 %
EOS (ABSOLUTE): 0.2 x10E3/uL (ref 0.0–0.4)
Eos: 3 %
Hematocrit: 42.4 % (ref 34.0–46.6)
Hemoglobin: 13.9 g/dL (ref 11.1–15.9)
Immature Grans (Abs): 0 x10E3/uL (ref 0.0–0.1)
Immature Granulocytes: 0 %
Lymphocytes Absolute: 1.4 x10E3/uL (ref 0.7–3.1)
Lymphs: 23 %
MCH: 29.9 pg (ref 26.6–33.0)
MCHC: 32.8 g/dL (ref 31.5–35.7)
MCV: 91 fL (ref 79–97)
Monocytes Absolute: 0.5 x10E3/uL (ref 0.1–0.9)
Monocytes: 8 %
Neutrophils Absolute: 3.9 x10E3/uL (ref 1.4–7.0)
Neutrophils: 65 %
Platelets: 252 x10E3/uL (ref 150–450)
RBC: 4.65 x10E6/uL (ref 3.77–5.28)
RDW: 13.9 % (ref 11.7–15.4)
WBC: 6 x10E3/uL (ref 3.4–10.8)

## 2024-12-13 LAB — COMPREHENSIVE METABOLIC PANEL WITH GFR
ALT: 11 IU/L (ref 0–32)
AST: 15 IU/L (ref 0–40)
Albumin: 4.4 g/dL (ref 3.9–4.9)
Alkaline Phosphatase: 55 IU/L (ref 41–116)
BUN/Creatinine Ratio: 13 (ref 9–23)
BUN: 12 mg/dL (ref 6–24)
Bilirubin Total: 0.7 mg/dL (ref 0.0–1.2)
CO2: 22 mmol/L (ref 20–29)
Calcium: 9.5 mg/dL (ref 8.7–10.2)
Chloride: 103 mmol/L (ref 96–106)
Creatinine, Ser: 0.93 mg/dL (ref 0.57–1.00)
Globulin, Total: 2.3 g/dL (ref 1.5–4.5)
Glucose: 89 mg/dL (ref 70–99)
Potassium: 4.4 mmol/L (ref 3.5–5.2)
Sodium: 141 mmol/L (ref 134–144)
Total Protein: 6.7 g/dL (ref 6.0–8.5)
eGFR: 76 mL/min/1.73

## 2024-12-13 LAB — LIPID PANEL
Chol/HDL Ratio: 2.8 ratio (ref 0.0–4.4)
Cholesterol, Total: 131 mg/dL (ref 100–199)
HDL: 46 mg/dL
LDL Chol Calc (NIH): 73 mg/dL (ref 0–99)
Triglycerides: 54 mg/dL (ref 0–149)
VLDL Cholesterol Cal: 12 mg/dL (ref 5–40)

## 2024-12-13 LAB — HEMOGLOBIN A1C
Est. average glucose Bld gHb Est-mCnc: 111 mg/dL
Hgb A1c MFr Bld: 5.5 % (ref 4.8–5.6)

## 2024-12-13 LAB — TSH: TSH: 2.7 u[IU]/mL (ref 0.450–4.500)

## 2024-12-19 ENCOUNTER — Ambulatory Visit (INDEPENDENT_AMBULATORY_CARE_PROVIDER_SITE_OTHER): Admitting: Family Medicine

## 2024-12-19 ENCOUNTER — Encounter: Payer: Self-pay | Admitting: Family Medicine

## 2024-12-19 VITALS — BP 128/80 | HR 73 | Temp 98.6°F | Ht 63.0 in | Wt 185.1 lb

## 2024-12-19 DIAGNOSIS — R7303 Prediabetes: Secondary | ICD-10-CM | POA: Diagnosis not present

## 2024-12-19 DIAGNOSIS — Z6832 Body mass index (BMI) 32.0-32.9, adult: Secondary | ICD-10-CM | POA: Diagnosis not present

## 2024-12-19 DIAGNOSIS — E66811 Obesity, class 1: Secondary | ICD-10-CM

## 2024-12-19 DIAGNOSIS — E6609 Other obesity due to excess calories: Secondary | ICD-10-CM

## 2024-12-19 DIAGNOSIS — I10 Essential (primary) hypertension: Secondary | ICD-10-CM | POA: Diagnosis not present

## 2024-12-19 DIAGNOSIS — G4733 Obstructive sleep apnea (adult) (pediatric): Secondary | ICD-10-CM | POA: Diagnosis not present

## 2024-12-19 DIAGNOSIS — Z0000 Encounter for general adult medical examination without abnormal findings: Secondary | ICD-10-CM | POA: Diagnosis not present

## 2024-12-19 MED ORDER — HYDROCHLOROTHIAZIDE 12.5 MG PO TABS: 12.5000 mg | ORAL_TABLET | Freq: Every day | ORAL | 3 refills | Status: AC

## 2024-12-19 MED ORDER — LOSARTAN POTASSIUM 50 MG PO TABS: 50.0000 mg | ORAL_TABLET | Freq: Every day | ORAL | 3 refills | Status: AC

## 2024-12-19 NOTE — Patient Instructions (Addendum)
 Make sure you get at least 2000 international units of vitamin D3 daily  For bone and overall health    Keep up the exercise  Especially strength training    Stay off metformin  for now  We may start back if you come off the zepbound in the future   Keep up the good work with diet and exercise and weight loss    Blood pressure looks good today

## 2024-12-19 NOTE — Assessment & Plan Note (Signed)
 Sleeping better after weight loss

## 2024-12-19 NOTE — Assessment & Plan Note (Signed)
 bp in fair control at this time  BP Readings from Last 1 Encounters:  12/19/24 128/80   No changes needed Most recent labs reviewed  Disc lifstyle change with low sodium diet and exercise  Improved with weight loss /fitness Hydrochlorothiazide  12.5 mg daily Continue losartan  50 mg daily   Lab today

## 2024-12-19 NOTE — Progress Notes (Signed)
 "  Subjective:    Patient ID: [name], female    DOB: [birth year], 48 y.o.   MRN: [id number]  HPI  Here for health maintenance exam and to review chronic medical problems   Wt Readings from Last 3 Encounters:  12/19/24 185 lb 2 oz (84 kg)  08/08/24 218 lb 2 oz (98.9 kg)  12/21/23 209 lb 8 oz (95 kg)   32.79 kg/m  Vitals:   12/19/24 1533 12/19/24 1557  BP: (!) 142/90 128/80  Pulse: 73   Temp: 98.6 F (37 C)   SpO2: 99%     Immunization History  Administered Date(s) Administered   Influenza,inj,Quad PF,6+ Mos 01/03/2016, 11/24/2016, 10/12/2021, 08/11/2022   Influenza-Unspecified 09/25/2014   PFIZER Comirnaty(Gray Top)Covid-19 Tri-Sucrose Vaccine 12/02/2020, 12/23/2020   Td 12/26/1991, 12/04/2007, 12/05/2007   Tdap 11/27/2017    Health Maintenance Due  Topic Date Due   Hepatitis B Vaccines 19-59 Average Risk (1 of 3 - 19+ 3-dose series) Never done   Mammogram  01/24/2024   Flu shot - declines   Some sinus symptoms this week- better now  No fever  Congestion - took day quil and it helped  Started flonase  back    Mammogram 12/2023 ? -sent for report/has next scheduled - gets at gyn office  Self breast exam-no lumps  Clip with bx in past- still ok   Gyn health Pap 12/2022 with 5 y recall  Sees gyn  STD screen  Declines need    Colon cancer screening  Colonoscopy 03/2019 with 10 y recall   Bone health   Falls-none Fractures-none  Supplements  Mvi daily  Vitamin D3 -occational forgets    Exercise  Going to a pilates gym/using reformer machine at least 2 times weekly  Stretch session at least once weekly  Just started systems analyst  Sciatica got worse and then this really helped   Lots of water intake   Mood    12/21/2023    4:02 PM 12/10/2023    3:35 PM 02/15/2023    9:40 AM 12/05/2022    4:49 PM 12/01/2020    5:00 PM  Depression screen PHQ 2/9  Decreased Interest 0 0 0 0 0  Down, Depressed, Hopeless 0 0 0 0 0  PHQ - 2  Score 0 0 0 0 0  Altered sleeping 0 1 0 1 1  Tired, decreased energy 1 1 1 1 1   Change in appetite 0 0 0 0 0  Feeling bad or failure about yourself  0 0 0 0 0  Trouble concentrating 0 0 0 0 0  Moving slowly or fidgety/restless 0 0 0 0 0  Suicidal thoughts 0 0 0 0 0  PHQ-9 Score 1  2  1  2  2    Difficult doing work/chores Not difficult at all Not difficult at all Not difficult at all Not difficult at all Not difficult at all     Data saved with a previous flowsheet row definition   Sleeping better with weight loss (had mild sleep apnea)  HTN bp is stable today  No cp or palpitations or headaches or edema  No side effects to medicines  BP Readings from Last 3 Encounters:  12/19/24 128/80  08/08/24 138/80  12/21/23 129/65     Lab Results  Component Value Date   NA 141 12/12/2024   K 4.4 12/12/2024   CO2 22 12/12/2024   GLUCOSE 89 12/12/2024   BUN 12 12/12/2024   CREATININE 0.93 12/12/2024  CALCIUM 9.5 12/12/2024   EGFR 76 12/12/2024   GFRNONAA >60 08/10/2022   Losartan  50 mg daily  Hydrochlorothiazide  12.5 mg daily  Prediabetes Lab Results  Component Value Date   HGBA1C 5.5 12/12/2024   HGBA1C 5.9 (H) 12/10/2023   HGBA1C 6.1 08/10/2023   Metformin  xr 500 mg daily (has not taken while on zepbound)    Obesity Taking zepbound 7.5  through weight watchers program  Meets with dietician and trainer through them  More fiber in diet also  Gets full faster Also not as hungry  Helps her cravings   Eating better!   Lab Results  Component Value Date   ALT 11 12/12/2024   AST 15 12/12/2024   ALKPHOS 55 12/12/2024   BILITOT 0.7 12/12/2024     Cholesterol Lab Results  Component Value Date   CHOL 131 12/12/2024   CHOL 167 12/10/2023   CHOL 169 12/01/2022   Lab Results  Component Value Date   HDL 46 12/12/2024   HDL 59 12/10/2023   HDL 61 12/01/2022   Lab Results  Component Value Date   LDLCALC 73 12/12/2024   LDLCALC 88 12/10/2023   LDLCALC 85  12/01/2022   Lab Results  Component Value Date   TRIG 54 12/12/2024   TRIG 112 12/10/2023   TRIG 135 12/01/2022   Lab Results  Component Value Date   CHOLHDL 2.8 12/12/2024   CHOLHDL 2.8 12/10/2023   CHOLHDL 2.8 12/01/2022   No results found for: LDLDIRECT  Lab Results  Component Value Date   TSH 2.700 12/12/2024   Lab Results  Component Value Date   WBC 6.0 12/12/2024   HGB 13.9 12/12/2024   HCT 42.4 12/12/2024   MCV 91 12/12/2024   PLT 252 12/12/2024      Patient Active Problem List   Diagnosis Date Noted   Dermatitis 02/15/2023   Encounter for screening mammogram for breast cancer 01/01/2023   Routine general medical examination at a health care facility 12/05/2022   Onychomycosis 12/05/2022   Leg cramp 12/05/2022   Tension headache 08/25/2022   Right low back pain 02/05/2020   Class 1 obesity due to excess calories with body mass index (BMI) of 32.0 to 32.9 in adult 12/03/2019   Hematuria, microscopic 03/04/2019   Prediabetes 11/27/2017   Sleep apnea 01/03/2016   Essential hypertension 09/16/2012   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   Allergic rhinitis 07/02/2007   GERD 07/02/2007   Past Medical History:  Diagnosis Date   Abnormal Pap smear    cin-1   Allergic rhinitis    seasonal   Allergy    Diabetes mellitus without complication (HCC)    boarderline per pt   GERD (gastroesophageal reflux disease)    Herpes simplex without mention of complication    hsv-2   Hypertension    Past Surgical History:  Procedure Laterality Date   BREAST BIOPSY Right 02/12/2023   stereo bx, calcs, X clip-path pending   BREAST BIOPSY Right 02/12/2023   MM RT BREAST BX W LOC DEV 1ST LESION IMAGE BX SPEC STEREO GUIDE 02/12/2023 [facility]   CERVIX LESION DESTRUCTION     COLPOSCOPY  1997   abnormal pap   LAMINECTOMY AND MICRODISCECTOMY LUMBAR SPINE  12/2007   L5-S1   PARTIAL HYSTERECTOMY  11/2005   bleeding   WISDOM TOOTH EXTRACTION     Social  History[1] Family History  Problem Relation Age of Onset   Hypertension Mother 78   Hypertension Father    Diabetes  Paternal Grandfather    Breast cancer Maternal Grandmother    Colon cancer Other        M.Great aunt   Stroke Paternal Grandmother    Stomach cancer Neg Hx    Pancreatic cancer Neg Hx    Esophageal cancer Neg Hx    Rectal cancer Neg Hx    Allergies[2] Medications Ordered Prior to Encounter[3]  Review of Systems  Constitutional:  Negative for activity change, appetite change, fatigue, fever and unexpected weight change.  HENT:  Negative for congestion, ear pain, rhinorrhea, sinus pressure and sore throat.   Eyes:  Negative for pain, redness and visual disturbance.  Respiratory:  Negative for cough, shortness of breath and wheezing.   Cardiovascular:  Negative for chest pain and palpitations.  Gastrointestinal:  Negative for abdominal pain, blood in stool, constipation and diarrhea.  Endocrine: Negative for polydipsia and polyuria.  Genitourinary:  Negative for dysuria, frequency and urgency.  Musculoskeletal:  Positive for back pain. Negative for arthralgias and myalgias.  Skin:  Negative for pallor and rash.  Allergic/Immunologic: Negative for environmental allergies.  Neurological:  Negative for dizziness, syncope and headaches.  Hematological:  Negative for adenopathy. Does not bruise/bleed easily.  Psychiatric/Behavioral:  Negative for decreased concentration and dysphoric mood. The patient is not nervous/anxious.        Objective:   Physical Exam Constitutional:      General: She is not in acute distress.    Appearance: Normal appearance. She is well-developed. She is not ill-appearing or diaphoretic.  HENT:     Head: Normocephalic and atraumatic.     Right Ear: Tympanic membrane, ear canal and external ear normal.     Left Ear: Tympanic membrane, ear canal and external ear normal.     Nose: Nose normal. No congestion.     Mouth/Throat:     Mouth:  Mucous membranes are moist.     Pharynx: Oropharynx is clear. No posterior oropharyngeal erythema.  Eyes:     General: No scleral icterus.    Extraocular Movements: Extraocular movements intact.     Conjunctiva/sclera: Conjunctivae normal.     Pupils: Pupils are equal, round, and reactive to light.  Neck:     Thyroid : No thyromegaly.     Vascular: No carotid bruit or JVD.  Cardiovascular:     Rate and Rhythm: Normal rate and regular rhythm.     Pulses: Normal pulses.     Heart sounds: Normal heart sounds.     No gallop.  Pulmonary:     Effort: Pulmonary effort is normal. No respiratory distress.     Breath sounds: Normal breath sounds. No wheezing.     Comments: Good air exch Chest:     Chest wall: No tenderness.  Abdominal:     General: Bowel sounds are normal. There is no distension or abdominal bruit.     Palpations: Abdomen is soft. There is no mass.     Tenderness: There is no abdominal tenderness.     Hernia: No hernia is present.  Genitourinary:    Comments: Breast and pelvic exam are done by gyn provider     Musculoskeletal:        General: No tenderness. Normal range of motion.     Cervical back: Normal range of motion and neck supple. No rigidity. No muscular tenderness.     Right lower leg: No edema.     Left lower leg: No edema.     Comments: No kyphosis   Lymphadenopathy:  Cervical: No cervical adenopathy.  Skin:    General: Skin is warm and dry.     Coloration: Skin is not pale.     Findings: No erythema or rash.  Neurological:     Mental Status: She is alert. Mental status is at baseline.     Cranial Nerves: No cranial nerve deficit.     Motor: No abnormal muscle tone.     Coordination: Coordination normal.     Gait: Gait normal.     Deep Tendon Reflexes: Reflexes are normal and symmetric. Reflexes normal.  Psychiatric:        Mood and Affect: Mood normal.        Cognition and Memory: Cognition and memory normal.           Assessment & Plan:    Problem List Items Addressed This Visit       Cardiovascular and Mediastinum   Essential hypertension   bp in fair control at this time  BP Readings from Last 1 Encounters:  12/19/24 128/80   No changes needed Most recent labs reviewed  Disc lifstyle change with low sodium diet and exercise  Improved with weight loss /fitness Hydrochlorothiazide  12.5 mg daily Continue losartan  50 mg daily   Lab today        Relevant Medications   hydrochlorothiazide  (HYDRODIURIL ) 12.5 MG tablet   losartan  (COZAAR ) 50 MG tablet     Respiratory   Sleep apnea   Sleeping better after weight loss         Other   Routine general medical examination at a health care facility - Primary   Reviewed health habits including diet and exercise and skin cancer prevention Reviewed appropriate screening tests for age  Also reviewed health mt list, fam hx and immunization status , as well as social and family history   See HPI Labs reviewed and ordered Health Maintenance  Topic Date Due   Hepatitis B Vaccine (1 of 3 - 19+ 3-dose series) Never done   Breast Cancer Screening  01/24/2024   COVID-19 Vaccine (3 - Pfizer risk series) 12/25/2024*   Flu Shot  03/24/2025*   DTaP/Tdap/Td vaccine (5 - Td or Tdap) 11/28/2027   Pap with HPV screening  01/02/2028   Colon Cancer Screening  03/25/2029   Hepatitis C Screening  Completed   HIV Screening  Completed   Pneumococcal Vaccine  Aged Out   HPV Vaccine  Aged Out   Meningitis B Vaccine  Aged Out  *Topic was postponed. The date shown is not the original due date.    Declines flu shot  Utd gyn care-sent for last mammo and pap report  Declines std screen/not needed  Discussed fall prevention, supplements and exercise for bone density  Encouraged to start vitamin D PHQ 1 Labs reviewed       Prediabetes   Lab Results  Component Value Date   HGBA1C 5.5 12/12/2024   HGBA1C 5.9 (H) 12/10/2023   HGBA1C 6.1 08/10/2023   Doing well with weight loss   Continues zepbound 7.5 mg weekly  Off metformin  -may return to this in future if stops the glp-1  disc imp of low glycemic diet and wt loss to prevent DM2       Class 1 obesity due to excess calories with body mass index (BMI) of 32.0 to 32.9 in adult   Doing well with weight loss Taking zepbound 7.5 mg weekly  Also good diet and exercise  Relevant Medications   ZEPBOUND 7.5 MG/0.5ML Pen      [1]  Social History Tobacco Use   Smoking status: Never   Smokeless tobacco: Never  Vaping Use   Vaping status: Never Used  Substance Use Topics   Alcohol use: Yes    Alcohol/week: 0.0 standard drinks of alcohol    Comment: social   Drug use: No  [2]  Allergies Allergen Reactions   Lisinopril      Cough    Morphine     REACTION: skin crawling   Oxycodone Itching   Rofecoxib     REACTION: blurred vision, dizziness  [3]  Current Outpatient Medications on File Prior to Visit  Medication Sig Dispense Refill   fluticasone  (FLONASE ) 50 MCG/ACT nasal spray Place 2 sprays into both nostrils daily. 48 g 3   Multiple Vitamin (MULTIVITAMIN) capsule Take 1 capsule by mouth daily.     valACYclovir  (VALTREX ) 1000 MG tablet TAKE ONE-HALF TABLET BY MOUTH  TWICE DAILY AS NEEDED 90 tablet 0   ZEPBOUND 7.5 MG/0.5ML Pen Inject 7.5 mg into the skin once a week.     No current facility-administered medications on file prior to visit.   "

## 2024-12-20 NOTE — Assessment & Plan Note (Signed)
 Lab Results  Component Value Date   HGBA1C 5.5 12/12/2024   HGBA1C 5.9 (H) 12/10/2023   HGBA1C 6.1 08/10/2023   Doing well with weight loss  Continues zepbound 7.5 mg weekly  Off metformin  -may return to this in future if stops the glp-1  disc imp of low glycemic diet and wt loss to prevent DM2

## 2024-12-20 NOTE — Assessment & Plan Note (Signed)
 Doing well with weight loss Taking zepbound 7.5 mg weekly  Also good diet and exercise

## 2024-12-20 NOTE — Assessment & Plan Note (Addendum)
 Reviewed health habits including diet and exercise and skin cancer prevention Reviewed appropriate screening tests for age  Also reviewed health mt list, fam hx and immunization status , as well as social and family history   See HPI Labs reviewed and ordered Health Maintenance  Topic Date Due   Hepatitis B Vaccine (1 of 3 - 19+ 3-dose series) Never done   Breast Cancer Screening  01/24/2024   COVID-19 Vaccine (3 - Pfizer risk series) 12/25/2024*   Flu Shot  03/24/2025*   DTaP/Tdap/Td vaccine (5 - Td or Tdap) 11/28/2027   Pap with HPV screening  01/02/2028   Colon Cancer Screening  03/25/2029   Hepatitis C Screening  Completed   HIV Screening  Completed   Pneumococcal Vaccine  Aged Out   HPV Vaccine  Aged Out   Meningitis B Vaccine  Aged Out  *Topic was postponed. The date shown is not the original due date.    Declines flu shot  Utd gyn care-sent for last mammo and pap report  Declines std screen/not needed  Discussed fall prevention, supplements and exercise for bone density  Encouraged to start vitamin D PHQ 1 Labs reviewed

## 2024-12-24 ENCOUNTER — Other Ambulatory Visit: Payer: Self-pay

## 2024-12-24 MED ORDER — ZEPBOUND 10 MG/0.5ML ~~LOC~~ SOAJ: SUBCUTANEOUS | 0 refills | Status: DC

## 2024-12-26 ENCOUNTER — Other Ambulatory Visit: Payer: Self-pay

## 2025-12-21 ENCOUNTER — Encounter: Admitting: Family Medicine
